# Patient Record
Sex: Female | Born: 1975 | Race: Black or African American | Hispanic: No | Marital: Married | Smoking: Never smoker
Health system: Southern US, Community
[De-identification: ages and names within clinical notes are randomized; demographics above are authoritative.]

## PROBLEM LIST (undated history)

## (undated) DIAGNOSIS — M24111 Other articular cartilage disorders, right shoulder: Secondary | ICD-10-CM

## (undated) DIAGNOSIS — Z87898 Personal history of other specified conditions: Secondary | ICD-10-CM

## (undated) DIAGNOSIS — I10 Essential (primary) hypertension: Secondary | ICD-10-CM

## (undated) DIAGNOSIS — K219 Gastro-esophageal reflux disease without esophagitis: Secondary | ICD-10-CM

## (undated) DIAGNOSIS — G4700 Insomnia, unspecified: Secondary | ICD-10-CM

## (undated) DIAGNOSIS — M7551 Bursitis of right shoulder: Secondary | ICD-10-CM

## (undated) DIAGNOSIS — D649 Anemia, unspecified: Secondary | ICD-10-CM

## (undated) DIAGNOSIS — F32A Depression, unspecified: Secondary | ICD-10-CM

## (undated) DIAGNOSIS — F329 Major depressive disorder, single episode, unspecified: Secondary | ICD-10-CM

## (undated) DIAGNOSIS — F419 Anxiety disorder, unspecified: Secondary | ICD-10-CM

## (undated) HISTORY — DX: Major depressive disorder, single episode, unspecified: F32.9

## (undated) HISTORY — DX: Insomnia, unspecified: G47.00

## (undated) HISTORY — PX: DIAGNOSTIC LAPAROSCOPY: SUR761

## (undated) HISTORY — DX: Anxiety disorder, unspecified: F41.9

## (undated) HISTORY — DX: Depression, unspecified: F32.A

## (undated) HISTORY — PX: WISDOM TOOTH EXTRACTION: SHX21

## (undated) HISTORY — PX: ECTOPIC PREGNANCY SURGERY: SHX613

## (undated) HISTORY — DX: Essential (primary) hypertension: I10

## (undated) HISTORY — DX: Gastro-esophageal reflux disease without esophagitis: K21.9

---

## 1997-08-17 ENCOUNTER — Emergency Department (HOSPITAL_COMMUNITY): Admission: EM | Admit: 1997-08-17 | Discharge: 1997-08-17 | Payer: Self-pay | Admitting: Internal Medicine

## 1998-09-23 ENCOUNTER — Encounter (INDEPENDENT_AMBULATORY_CARE_PROVIDER_SITE_OTHER): Payer: Self-pay | Admitting: Specialist

## 1998-09-23 ENCOUNTER — Other Ambulatory Visit: Admission: RE | Admit: 1998-09-23 | Discharge: 1998-09-23 | Payer: Self-pay | Admitting: Obstetrics and Gynecology

## 1998-10-21 ENCOUNTER — Emergency Department (HOSPITAL_COMMUNITY): Admission: EM | Admit: 1998-10-21 | Discharge: 1998-10-21 | Payer: Self-pay | Admitting: Emergency Medicine

## 1998-12-23 ENCOUNTER — Inpatient Hospital Stay (HOSPITAL_COMMUNITY): Admission: AD | Admit: 1998-12-23 | Discharge: 1998-12-23 | Payer: Self-pay | Admitting: Obstetrics and Gynecology

## 1999-04-20 ENCOUNTER — Ambulatory Visit (HOSPITAL_COMMUNITY): Admission: RE | Admit: 1999-04-20 | Discharge: 1999-04-20 | Payer: Self-pay | Admitting: Obstetrics and Gynecology

## 1999-05-05 ENCOUNTER — Encounter: Admission: RE | Admit: 1999-05-05 | Discharge: 1999-05-13 | Payer: Self-pay | Admitting: Obstetrics and Gynecology

## 1999-05-28 ENCOUNTER — Inpatient Hospital Stay (HOSPITAL_COMMUNITY): Admission: AD | Admit: 1999-05-28 | Discharge: 1999-05-28 | Payer: Self-pay | Admitting: *Deleted

## 1999-07-14 ENCOUNTER — Inpatient Hospital Stay (HOSPITAL_COMMUNITY): Admission: AD | Admit: 1999-07-14 | Discharge: 1999-07-14 | Payer: Self-pay | Admitting: *Deleted

## 1999-09-05 ENCOUNTER — Inpatient Hospital Stay (HOSPITAL_COMMUNITY): Admission: AD | Admit: 1999-09-05 | Discharge: 1999-09-08 | Payer: Self-pay | Admitting: Obstetrics and Gynecology

## 1999-10-06 ENCOUNTER — Other Ambulatory Visit: Admission: RE | Admit: 1999-10-06 | Discharge: 1999-10-06 | Payer: Self-pay | Admitting: Obstetrics and Gynecology

## 2001-04-04 ENCOUNTER — Emergency Department (HOSPITAL_COMMUNITY): Admission: EM | Admit: 2001-04-04 | Discharge: 2001-04-04 | Payer: Self-pay | Admitting: Emergency Medicine

## 2001-04-04 ENCOUNTER — Encounter: Payer: Self-pay | Admitting: Emergency Medicine

## 2001-08-28 ENCOUNTER — Other Ambulatory Visit: Admission: RE | Admit: 2001-08-28 | Discharge: 2001-08-28 | Payer: Self-pay | Admitting: Gynecology

## 2002-09-29 ENCOUNTER — Encounter: Admission: RE | Admit: 2002-09-29 | Discharge: 2002-09-29 | Payer: Self-pay | Admitting: Internal Medicine

## 2002-09-29 ENCOUNTER — Encounter: Payer: Self-pay | Admitting: Internal Medicine

## 2003-09-01 ENCOUNTER — Emergency Department (HOSPITAL_COMMUNITY): Admission: EM | Admit: 2003-09-01 | Discharge: 2003-09-01 | Payer: Self-pay | Admitting: Emergency Medicine

## 2003-09-08 ENCOUNTER — Ambulatory Visit (HOSPITAL_BASED_OUTPATIENT_CLINIC_OR_DEPARTMENT_OTHER): Admission: RE | Admit: 2003-09-08 | Discharge: 2003-09-08 | Payer: Self-pay | Admitting: Orthopedic Surgery

## 2003-09-08 HISTORY — PX: ORIF ANKLE FRACTURE: SHX5408

## 2004-10-06 ENCOUNTER — Other Ambulatory Visit: Admission: RE | Admit: 2004-10-06 | Discharge: 2004-10-06 | Payer: Self-pay | Admitting: Obstetrics and Gynecology

## 2004-10-13 ENCOUNTER — Encounter: Admission: RE | Admit: 2004-10-13 | Discharge: 2004-10-13 | Payer: Self-pay | Admitting: Obstetrics and Gynecology

## 2005-03-15 ENCOUNTER — Ambulatory Visit (HOSPITAL_COMMUNITY): Admission: RE | Admit: 2005-03-15 | Discharge: 2005-03-15 | Payer: Self-pay | Admitting: Obstetrics and Gynecology

## 2006-07-15 ENCOUNTER — Emergency Department (HOSPITAL_COMMUNITY): Admission: EM | Admit: 2006-07-15 | Discharge: 2006-07-15 | Payer: Self-pay | Admitting: Emergency Medicine

## 2006-12-05 ENCOUNTER — Emergency Department (HOSPITAL_COMMUNITY): Admission: EM | Admit: 2006-12-05 | Discharge: 2006-12-05 | Payer: Self-pay | Admitting: Emergency Medicine

## 2007-11-06 ENCOUNTER — Ambulatory Visit: Payer: Self-pay | Admitting: Internal Medicine

## 2007-11-06 DIAGNOSIS — R0789 Other chest pain: Secondary | ICD-10-CM | POA: Insufficient documentation

## 2007-11-06 DIAGNOSIS — D649 Anemia, unspecified: Secondary | ICD-10-CM | POA: Insufficient documentation

## 2007-11-06 DIAGNOSIS — G479 Sleep disorder, unspecified: Secondary | ICD-10-CM | POA: Insufficient documentation

## 2007-11-06 DIAGNOSIS — J45909 Unspecified asthma, uncomplicated: Secondary | ICD-10-CM | POA: Insufficient documentation

## 2007-11-06 DIAGNOSIS — R0602 Shortness of breath: Secondary | ICD-10-CM | POA: Insufficient documentation

## 2007-11-06 DIAGNOSIS — R011 Cardiac murmur, unspecified: Secondary | ICD-10-CM | POA: Insufficient documentation

## 2007-11-08 ENCOUNTER — Telehealth: Payer: Self-pay | Admitting: *Deleted

## 2007-11-12 ENCOUNTER — Telehealth: Payer: Self-pay | Admitting: Internal Medicine

## 2007-11-14 ENCOUNTER — Ambulatory Visit: Payer: Self-pay

## 2007-11-14 ENCOUNTER — Encounter: Payer: Self-pay | Admitting: Internal Medicine

## 2007-11-14 ENCOUNTER — Ambulatory Visit: Payer: Self-pay | Admitting: Internal Medicine

## 2007-11-19 ENCOUNTER — Telehealth: Payer: Self-pay | Admitting: *Deleted

## 2007-11-22 ENCOUNTER — Ambulatory Visit: Payer: Self-pay | Admitting: Cardiology

## 2007-11-29 ENCOUNTER — Ambulatory Visit: Payer: Self-pay

## 2007-11-29 ENCOUNTER — Ambulatory Visit: Payer: Self-pay | Admitting: Cardiology

## 2008-04-15 ENCOUNTER — Encounter: Admission: RE | Admit: 2008-04-15 | Discharge: 2008-04-15 | Payer: Self-pay | Admitting: Cardiology

## 2008-07-01 ENCOUNTER — Encounter (INDEPENDENT_AMBULATORY_CARE_PROVIDER_SITE_OTHER): Payer: Self-pay | Admitting: *Deleted

## 2008-11-05 ENCOUNTER — Ambulatory Visit: Payer: Self-pay | Admitting: Family Medicine

## 2008-11-10 ENCOUNTER — Ambulatory Visit: Payer: Self-pay | Admitting: Internal Medicine

## 2008-11-10 DIAGNOSIS — R51 Headache: Secondary | ICD-10-CM | POA: Insufficient documentation

## 2008-11-10 DIAGNOSIS — R519 Headache, unspecified: Secondary | ICD-10-CM | POA: Insufficient documentation

## 2008-12-02 ENCOUNTER — Ambulatory Visit: Payer: Self-pay | Admitting: Cardiology

## 2008-12-02 DIAGNOSIS — E785 Hyperlipidemia, unspecified: Secondary | ICD-10-CM | POA: Insufficient documentation

## 2009-04-04 ENCOUNTER — Emergency Department (HOSPITAL_COMMUNITY): Admission: EM | Admit: 2009-04-04 | Discharge: 2009-04-04 | Payer: Self-pay | Admitting: Emergency Medicine

## 2009-10-22 ENCOUNTER — Inpatient Hospital Stay (HOSPITAL_COMMUNITY): Admission: AD | Admit: 2009-10-22 | Discharge: 2009-10-22 | Payer: Self-pay | Admitting: Obstetrics and Gynecology

## 2009-12-01 ENCOUNTER — Inpatient Hospital Stay (HOSPITAL_COMMUNITY): Admission: RE | Admit: 2009-12-01 | Discharge: 2009-12-04 | Payer: Self-pay | Admitting: Obstetrics and Gynecology

## 2010-03-06 LAB — CONVERTED CEMR LAB
AST: 20 units/L (ref 0–37)
Alkaline Phosphatase: 77 units/L (ref 39–117)
BUN: 8 mg/dL (ref 6–23)
Basophils Absolute: 0 10*3/uL (ref 0.0–0.1)
Basophils Relative: 0.2 % (ref 0.0–3.0)
CO2: 23 meq/L (ref 19–32)
Calcium: 9 mg/dL (ref 8.4–10.5)
Eosinophils Relative: 1.1 % (ref 0.0–5.0)
HCT: 32.8 % — ABNORMAL LOW (ref 36.0–46.0)
Lymphocytes Relative: 40.7 % (ref 12.0–46.0)
MCHC: 32.9 g/dL (ref 30.0–36.0)
Monocytes Absolute: 0.5 10*3/uL (ref 0.1–1.0)
Monocytes Relative: 12.9 % — ABNORMAL HIGH (ref 3.0–12.0)
Neutro Abs: 1.6 10*3/uL (ref 1.4–7.7)
Neutrophils Relative %: 45.1 % (ref 43.0–77.0)
Platelets: 297 10*3/uL (ref 150–400)
Potassium: 3.6 meq/L (ref 3.5–5.1)
RBC: 4.42 M/uL (ref 3.87–5.11)
Sodium: 136 meq/L (ref 135–145)
Total Protein: 7.3 g/dL (ref 6.0–8.3)
VLDL: 7 mg/dL (ref 0–40)
WBC: 3.5 10*3/uL — ABNORMAL LOW (ref 4.5–10.5)

## 2010-04-20 LAB — TYPE AND SCREEN: ABO/RH(D): O POS

## 2010-04-20 LAB — CBC
HCT: 22.5 % — ABNORMAL LOW (ref 36.0–46.0)
HCT: 28 % — ABNORMAL LOW (ref 36.0–46.0)
Hemoglobin: 8.7 g/dL — ABNORMAL LOW (ref 12.0–15.0)
MCH: 20 pg — ABNORMAL LOW (ref 26.0–34.0)
MCH: 20 pg — ABNORMAL LOW (ref 26.0–34.0)
MCHC: 31.2 g/dL (ref 30.0–36.0)
MCV: 64.9 fL — ABNORMAL LOW (ref 78.0–100.0)
RBC: 3.46 MIL/uL — ABNORMAL LOW (ref 3.87–5.11)
RDW: 25.8 % — ABNORMAL HIGH (ref 11.5–15.5)
WBC: 7 10*3/uL (ref 4.0–10.5)

## 2010-04-20 LAB — SURGICAL PCR SCREEN
MRSA, PCR: NEGATIVE
Staphylococcus aureus: NEGATIVE

## 2010-04-21 LAB — URINALYSIS, ROUTINE W REFLEX MICROSCOPIC
Nitrite: NEGATIVE
Protein, ur: NEGATIVE mg/dL
Specific Gravity, Urine: 1.01 (ref 1.005–1.030)
Urobilinogen, UA: 1 mg/dL (ref 0.0–1.0)

## 2010-04-21 LAB — WET PREP, GENITAL

## 2010-04-21 LAB — FETAL FIBRONECTIN: Fetal Fibronectin: NEGATIVE

## 2010-04-21 LAB — URINE MICROSCOPIC-ADD ON

## 2010-06-21 NOTE — Assessment & Plan Note (Signed)
[facility]                            CARDIOLOGY OFFICE NOTE   NAME:Breanna [name], Breanna [name]                   MRN:          [id number]  DATE:11/22/2007                            DOB:          1975-04-10    ADDENDUM   PRIMARY CARE PHYSICIAN:  Neta Mends. Panosh, MD   The addendum is that the patient's LDL cholesterol was obtained.  Her  LDL is 172.  This is quite high.  I did not talk to the patient at  length about the need for diet and exercise; however, I think it is  going to be difficult for her to lower her LDL down to below 130, which  would be my goal for her, therefore, I will start her on a low dose of  Lipitor at 10 mg daily and hopefully in combination with diet and  exercising, we can get her LDL to at least down below 130.  If she does  indeed have any problems on her stress test or on her exercise treadmill  test, which I doubt, we will attempt to get her LDL even lower.  I will  bring her back in 3 months.  We will check her lipids and her LFTs and  additionally we will check a Lp(a).     Marca Ancona, MD     DM/MedQ  DD: 11/22/2007  DT: 11/22/2007  Job #: [id number]   cc:   Neta Mends. Fabian Sharp, MD

## 2010-06-21 NOTE — Assessment & Plan Note (Signed)
[facility]                            CARDIOLOGY OFFICE NOTE   NAME:MCINTYRE, TANEA MOGA                   MRN:          [id number]  DATE:11/22/2007                            DOB:          August 25, 1975    PRIMARY CARE PHYSICIAN:  Neta Mends. Panosh, MD   HISTORY OF PRESENT ILLNESS:  This is a 35 year old with a history of  asthma who presents to Cardiology Clinic for initial evaluation of chest  tightness and shortness of breath.  The patient states that she has had  intermittent episodes of chest tightness and short of breath for about  10 years; however, over the last month she has had daily up to several  times a day episodes of central chest tightness and squeezing associated  with shortness of breath.  These episodes can last 30 minutes to 2  hours.  Most of these episodes do seem to be related to stress; however,  they do not tend to be exertional and many will occur while she is at  rest.  They often happen when she is at work.  She works as a Public librarian at [employer], which has been a fairly high  stress job.  Additionally, she sometimes gets these episodes at home  when her sons are acting up and sometimes the episodes just happen when  she is at rest.  She states that she has mainly been under a little bit  more stress than normal this month; however, she states not that much  more than normal.  She has been somewhat more busy at work.  She is not  very active, gets minimal exercise due to her busy work schedule.  She  does not describe shortness of breath or chest pain with exertion.  She  states that 1 year ago she went to the emergency department with an  episode of shortness of breath that was not associated with chest pain.  She states that the doctor in the emergency department heard wheezing  and diagnosed her with asthma.  She was prescribed with inhalers;  however, she has never used inhalers.  She states that  she does not  notice wheezing when she has chest pain episodes.   PAST MEDICAL HISTORY:  1. Asthma.  This was diagnosed about a year ago in the emergency      department; however, the patient is not using any inhalers.  2. Iron-deficiency anemia.   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  She answers phones throughout the day.  She states that she  smokes maybe 1-2 cigarettes every 2-3 months.  She usually smokes when  she is under a lot of stress.  She drinks alcohol rarely.  Does not use  any illicit drugs.   FAMILY HISTORY:  The patient states that her mother had an episode of  severe chest pain when she was 21 and she was hospitalized; however, the  patient does not know exactly what the diagnosis at that time was.  She  does know her mother does take some medications  and she has  hypertension.  Her grandmother had coronary artery bypass grafting in  her 64s and grandfather had coronary artery bypass grafting in his 67s.   MEDICATIONS:  She takes vitamin D.   No known drug allergies.   Review of systems is negative except as noted in history of present  illness.   EKG shows normal sinus rhythm, is a normal EKG.   Most recent labs in September showed triglycerides of 33, HDL of 60;  however, the LDL has been completed.  Hematocrit 32.8.  LFTs normal.  Creatinine 0.7.  Thyroid function is normal.  Echocardiogram done this  month, October 2009 shows EF 55-60%, grade 1 diastolic dysfunction.  Left atrium in the upper limits of normal.  Pulmonary artery systolic  pressure 21 mmHg.  No significant valvular dysfunction.  No regional  wall motion abnormalities in left ventricle.   PHYSICAL EXAMINATION:  VITAL SIGNS:  Blood pressure is 110/76, heart  rate is 70 and regular, weight is 180 pounds.  GENERAL:  This is a well-developed female in no apparent distress.  NEUROLOGICAL:  Alert and oriented x3.  Normal affect.  HEENT:  Normal.  NECK:  No  thyromegaly or thyroid nodule.  There is no JVD.  HEART:  Regular S1 and S2.  No S3, no S4, no murmur.  There is no  peripheral edema.  There are 2+ posterior tibial pulses bilaterally.  ABDOMEN:  Soft, nontender.  No hepatosplenomegaly.  EXTREMITIES:  There is no clubbing or cyanosis.  SKIN:  Normal.  MUSCULOSKELETAL:  Normal.   ASSESSMENT/PLAN:  This is a 35 year old with history of significant  chest tightness, squeezing, and shortness of breath.  These episodes are  not associated in particular with exertion.  They often come on with  stress.  She does not have lot of cardiac risk factors.  I am uncertain  of her LDL level.  It is possible that she has a family history of early  coronary disease as her mother may have had an event in her 30s;  however, I do not have all the details on that.  She does smoke only  rarely and her blood pressure is excellent.  I think a more likely cause  of her chest pain may be asthma as she was diagnosed with asthma with  shortness of breath and wheezing about a year ago and she has not been  on any inhalers, this could be a possible cause of her episodes of chest  tightness and shortness of breath.  These also could be a type of panic  attack given that they often happen with stress.  Therefore, my plan for  her will be to give her a trial of an Advair Diskus to see if that helps  her symptoms at all.  Additionally, we will bring her into the office  for an exercise treadmill test just to make sure that there is no  evidence for any ischemia given the possible history of early coronary  disease in her mother and we will also check an LDL cholesterol since  that apparently was deleted from her prior labs.  We can go ahead and do  that today.   ADDENDUM:  The addendum is that the patient's LDL cholesterol was  obtained.  Her LDL is 172.  This is quite high.  I did talk to the  patient at length about the need for diet and exercise; however, I think   it is going to be difficult  for her to lower her LDL down to below 130,  which would be my goal for her, therefore, I will start her on a low  dose of Lipitor at 10 mg daily and hopefully in combination with diet  and exercising, we can get her LDL to at least down below 130.  If she  does indeed have any problems on her exercise treadmill test, which I  doubt, we will  attempt to get her LDL even lower.  I will bring her back in 3 months.  We will check her lipids and her LFTs and additionally we will check a  Lp(a).     Marca Ancona, MD  Electronically Signed    DM/MedQ  DD: 11/22/2007  DT: 11/22/2007  Job #: [id number]   cc:   Neta Mends. Fabian Sharp, MD

## 2010-06-21 NOTE — Procedures (Signed)
[facility]                              EXERCISE TREADMILL   NAME:[name], Breanna BAINS                   MRN:          [id number]  DATE:11/29/2007                            DOB:          February 24, 1975    PROCEDURE:  Exercise treadmill test.   INDICATION:  Substernal chest pain, hypercholesterolemia, and obesity.   PROCEDURE:  The patient exercised according to standard Bruce protocol  for 7 minutes achieving a work level of 8.5 METS, the resting heart rate  of 94 beats per minute, ratio of maximal heart rate of 187 beats per  minute.  This value represents 98% of the maximal age predicted heart  rate.  The resting blood pressure 103/61 rose to maximum blood pressure  of 138/70.  The exercise test was stopped due to fatigue.  There was no  chest pain.   INTERPRETATION:  The resting EKG was normal, showed normal sinus rhythm  and no ST-T wave changes.  Blood pressure response to exercise was  normal.  Heart rate response to exercise showed an accelerate rise in  the heart rate beginning at stage I.  There was no chest pain.  There  were no arrhythmias.  There are no ischemic ST-segment changes.   CONCLUSION:  This symptom-limited exercise treadmill test shows no  evidence for ischemia with normal EKG response.  Given the patient's  age, she does appear to have below normal exercise capacity.  I did  encourage her to increase her exercise level with walking at least 5  times a week.  The patient will follow up with Dr. Fabian Sharp.  She has been  started on Lipitor given her elevated LDL and she will need a lipid and  LFT check in approximately 3 months.     Marca Ancona, MD  Electronically Signed    DM/MedQ  DD: 11/29/2007  DT: 11/30/2007  Job #: [id number]   cc:   Neta Mends. Fabian Sharp, MD

## 2010-06-24 NOTE — Discharge Summary (Signed)
[facility]  Patient:    Breanna [name], Breanna [name]                   MRN: [id number] Adm. Date:  [id number] Disc. Date: [id number] Attending:  Cordelia Pen Ii Dictator:   Danie Chandler, R.N.                           Discharge Summary  ADMISSION DIAGNOSES:          1. Intrauterine pregnancy at 38-1/[redacted] weeks                                  gestation.                               2. Previous cesarean section, desires repeat.  DISCHARGE DIAGNOSES:          1. Intrauterine pregnancy at 38-1/[redacted] weeks                                  gestation.                               2. Previous cesarean section, desires repeat.                               3. Anemia.  PROCEDURES:                   On September 05, 1999, repeat low transverse cesarean section.  HISTORY OF PRESENT ILLNESS:   The patient is a 35 year old, single, black female, gravida 2, para 1, with an estimated date of confinement of September 16, 1999.  The patients history was complicated by a positive gonorrhea culture on December 15, 1998, which was treated with subsequent negative test of cure. The patient had a previous cesarean section and desires repeat.  HOSPITAL COURSE:              The patient was taken to the operating room and underwent the above-named procedure without complications.  This was productive of a viable female infant with Apgars of 9 at one minute and 9 at five minutes and an arterial cord pH of 7.30.  Postoperatively, the patient did well.  On postoperative day #1, the patients hemoglobin was 7.1.  She was started on iron daily.  Her vital signs were stable.  On postoperative day #2, the patients hemoglobin was 6.9.  She had a repeat CBC ordered for the following day.  On postoperative day #3, the patient was without complaints and the hemoglobin was stable at 7.5.  She was tolerating a regular diet and had a good return of bowel function.  She was also ambulating well  without difficulty and had good pain control.  CONDITION ON DISCHARGE:       Good.  DIET:                         Regular as tolerated.  ACTIVITY:                     No heavy lifting,  no driving, and no vaginal entry.  FOLLOW-UP:                    She is to follow up in the office in one to two weeks for incision check.  She is to call for temperature greater than 100 degrees, persistent nausea, vomiting, heavy vaginal bleeding, and/or redness or drainage from the incision site.  DISCHARGE MEDICATIONS:        1. Tylox, #20, one p.o. q.4-6h. p.r.n. pain.                               2. Nu-Iron 150 mg, #30, one p.o. q.d. DD:  09/22/99 TD:  09/23/99 Job: [id number]

## 2010-06-24 NOTE — Op Note (Signed)
NAME:  Breanna [name], Breanna [name]                      ACCOUNT NO.:  [id number]   MEDICAL RECORD NO.:  [id number]                   PATIENT TYPE:  AMB   LOCATION:  [facility]                                  FACILITY:  [facility]   PHYSICIAN:  Robert A. Thurston Hole, M.D.              DATE OF BIRTH:  08-31-1975   DATE OF PROCEDURE:  09/08/2003  DATE OF DISCHARGE:                                 OPERATIVE REPORT   PREOPERATIVE DIAGNOSES:  Right ankle fracture with syndesmosis disruption.   POSTOPERATIVE DIAGNOSES:  Right ankle fracture with syndesmosis disruption.   PROCEDURE:  Open reduction and internal fixation of right ankle  fracture/syndesmosis disruption.   SURGEON:  Elana Alm. Thurston Hole, M.D.   ASSISTANT:  Julien Girt, P.A.   ANESTHESIA:  General.   OPERATIVE TIME:  Was 30 minutes.   COMPLICATIONS:  None.   INDICATIONS FOR PROCEDURE:  The patient is a 35 year old woman who sustained  a twisting injury to her right ankle one week ago, sustaining a syndesmosis  disruption with displacement of the mortis.  She is now to undergo an ORIF  of this.   DESCRIPTION OF PROCEDURE:  The patient was brought to the operating room on  September 08, 2003, and placed on the operating room table in the supine  position.  After an adequate level of general anesthesia was obtained, she  received Ancef 1 g IV preoperatively for prophylaxis.  Her right foot and  leg were prepped using sterile DuraPrep and draped using a sterile  technique.  Initially through a 1 cm longitudinal incision based over the  distal fibula shaft, using fluoroscopic control at the level of the tibial  epiphyseal scar, an initial incision was made.  The underlying subcutaneous  tissues were incised in line with the skin incision.  Using a guide pin from  the cannulated 4.5 mm screw set, and holding the mortis in a reduced and  anatomic position, a guide wire was placed from the fibula across the  syndesmosis into the distal tibial  metaphysis.  This was measured for length  and then over-drilled with a 3.2 mm drill, and then a cannulated 4.5 mm x  54.0 mm screw was placed, holding the ankle in a reduced and anatomic  position.  With this screw in place and position, and tying down, the mortis  was anatomic.  There was minimal instability on stress testing and external  rotation, and with the ankle in neutral, the mortis was completely anatomic.  At this point then the wound was irrigated and closed with interrupted #4-0  nylon suture.  Sterile dressings and a short-leg splint were applied.  Then the patient was awakened and taken to the recovery room in stable  condition.   FOLLOW-UP CARE:  The patient will be followed as an outpatient on Percocet  for pain.  I will see her back in the office in one week for a wound check  and  followup.                                               Robert A. Thurston Hole, M.D.    RAW/MEDQ  D:  09/08/2003  T:  09/08/2003  Job:  [id number]

## 2010-11-08 ENCOUNTER — Other Ambulatory Visit: Payer: Self-pay | Admitting: Obstetrics and Gynecology

## 2010-11-16 LAB — BASIC METABOLIC PANEL
Chloride: 103
GFR calc non Af Amer: >60
Glucose, Bld: 149 — ABNORMAL HIGH
Potassium: 3.3 — ABNORMAL LOW
Sodium: 134 — ABNORMAL LOW

## 2010-11-16 LAB — URINALYSIS, ROUTINE W REFLEX MICROSCOPIC
Bilirubin Urine: NEGATIVE
Glucose, UA: NEGATIVE
Nitrite: NEGATIVE
Specific Gravity, Urine: 1.014
pH: 7

## 2010-11-16 LAB — PREGNANCY, URINE: Preg Test, Ur: NEGATIVE

## 2010-11-24 LAB — DIFFERENTIAL
Lymphs Abs: 1.7
Monocytes Relative: 13 — ABNORMAL HIGH
Neutro Abs: 2.2
Neutrophils Relative %: 48

## 2010-11-24 LAB — I-STAT 8, (EC8 V) (CONVERTED LAB)
Bicarbonate: 21.4
Glucose, Bld: 86
TCO2: 22
pCO2, Ven: 29.9 — ABNORMAL LOW
pH, Ven: 7.463 — ABNORMAL HIGH

## 2010-11-24 LAB — POCT I-STAT CREATININE: Operator id: 257131

## 2010-11-24 LAB — POCT CARDIAC MARKERS
Myoglobin, poc: 39.2
Operator id: 257131
Troponin i, poc: <0.05

## 2010-11-24 LAB — POCT PREGNANCY, URINE: Preg Test, Ur: NEGATIVE

## 2010-11-24 LAB — CBC
Hemoglobin: 10.9 — ABNORMAL LOW
MCV: 78.4
RBC: 4.34
WBC: 4.6

## 2012-07-05 ENCOUNTER — Other Ambulatory Visit: Payer: Self-pay | Admitting: Obstetrics and Gynecology

## 2012-07-17 ENCOUNTER — Encounter (HOSPITAL_COMMUNITY): Payer: Self-pay | Admitting: Pharmacist

## 2012-07-18 MED ORDER — ONDANSETRON HCL 4 MG/2ML IJ SOLN
INTRAMUSCULAR | Status: AC
  Filled 2012-07-18: qty 2

## 2012-07-24 ENCOUNTER — Encounter (HOSPITAL_COMMUNITY)
Admission: RE | Admit: 2012-07-24 | Discharge: 2012-07-24 | Disposition: A | Discharge status: Home / Self Care | Payer: 59 | Source: Ambulatory Visit | Attending: Obstetrics and Gynecology | Admitting: Obstetrics and Gynecology

## 2012-07-24 ENCOUNTER — Encounter (HOSPITAL_COMMUNITY): Payer: Self-pay

## 2012-07-24 LAB — CBC
HCT: 39.4 % (ref 36.0–46.0)
Hemoglobin: 13 g/dL (ref 12.0–15.0)
MCHC: 33 g/dL (ref 30.0–36.0)
MCV: 79.3 fL (ref 78.0–100.0)

## 2012-07-24 LAB — SURGICAL PCR SCREEN: Staphylococcus aureus: NEGATIVE

## 2012-07-24 MED ORDER — MIDAZOLAM HCL 2 MG/2ML IJ SOLN
INTRAMUSCULAR | Status: AC
  Filled 2012-07-24: qty 2

## 2012-07-24 NOTE — Patient Instructions (Addendum)
20 Breanna [name]  07/24/2012   Your procedure is scheduled on:  07/25/12  Enter through the Main Entrance of [facility] at 6 AM.  Pick up the phone at the desk and dial [phone].   Call this number if you have problems the morning of surgery: [phone]   Remember:   Do not eat food:After Midnight.  Do not drink clear liquids: After Midnight.  Take these medicines the morning of surgery with A SIP OF WATER: NA   Do not wear jewelry, make-up or nail polish.  Do not wear lotions, powders, or perfumes. You may wear deodorant.  Do not shave 48 hours prior to surgery.  Do not bring valuables to the hospital.  [facility] is not responsible                  for any belongings or valuables brought to the hospital.  Contacts, dentures or bridgework may not be worn into surgery.  Leave suitcase in the car. After surgery it may be brought to your room.  For patients admitted to the hospital, checkout time is 11:00 AM the day of                discharge.   Patients discharged the day of surgery will not be allowed to drive                   home.  Name and phone number of your driver: [name]  Special Instructions: Shower using CHG 2 nights before surgery and the night before surgery.  If you shower the day of surgery use CHG.  Use special wash - you have one bottle of CHG for all showers.  You should use approximately 1/3 of the bottle for each shower.   Please read over the following fact sheets that you were given: MRSA Information

## 2012-07-25 ENCOUNTER — Ambulatory Visit (HOSPITAL_COMMUNITY)
Admission: RE | Admit: 2012-07-25 | Discharge: 2012-07-25 | Disposition: A | Discharge status: Home / Self Care | Payer: 59 | Source: Ambulatory Visit | Attending: Obstetrics and Gynecology | Admitting: Obstetrics and Gynecology

## 2012-07-25 ENCOUNTER — Encounter (HOSPITAL_COMMUNITY): Payer: Self-pay | Admitting: Anesthesiology

## 2012-07-25 ENCOUNTER — Encounter (HOSPITAL_COMMUNITY): Payer: Self-pay | Admitting: *Deleted

## 2012-07-25 ENCOUNTER — Encounter (HOSPITAL_COMMUNITY)
Admission: RE | Disposition: A | Discharge status: Home / Self Care | Payer: Self-pay | Source: Ambulatory Visit | Attending: Obstetrics and Gynecology

## 2012-07-25 ENCOUNTER — Ambulatory Visit (HOSPITAL_COMMUNITY): Payer: 59 | Admitting: Anesthesiology

## 2012-07-25 DIAGNOSIS — Z302 Encounter for sterilization: Secondary | ICD-10-CM | POA: Insufficient documentation

## 2012-07-25 DIAGNOSIS — E669 Obesity, unspecified: Secondary | ICD-10-CM | POA: Insufficient documentation

## 2012-07-25 DIAGNOSIS — Z30432 Encounter for removal of intrauterine contraceptive device: Secondary | ICD-10-CM | POA: Insufficient documentation

## 2012-07-25 DIAGNOSIS — IMO0001 Reserved for inherently not codable concepts without codable children: Secondary | ICD-10-CM

## 2012-07-25 HISTORY — PX: LAPAROSCOPIC TUBAL LIGATION: SHX1937

## 2012-07-25 HISTORY — PX: IUD REMOVAL: SHX5392

## 2012-07-25 SURGERY — LIGATION, FALLOPIAN TUBE, LAPAROSCOPIC
Anesthesia: General | Site: Vagina | Wound class: Clean

## 2012-07-25 MED ORDER — ROCURONIUM BROMIDE 100 MG/10ML IV SOLN
INTRAVENOUS | Status: DC | PRN
  Administered 2012-07-25: 25 mg via INTRAVENOUS

## 2012-07-25 MED ORDER — ACETAMINOPHEN 160 MG/5ML PO SOLN
ORAL | Status: AC
  Administered 2012-07-25: 975 mg via ORAL
  Filled 2012-07-25: qty 40.6

## 2012-07-25 MED ORDER — ACETAMINOPHEN 160 MG/5ML PO SOLN: 975.0000 mg | Freq: Once | ORAL | Status: AC

## 2012-07-25 MED ORDER — BUPIVACAINE HCL (PF) 0.25 % IJ SOLN
INTRAMUSCULAR | Status: AC
  Filled 2012-07-25: qty 30

## 2012-07-25 MED ORDER — CEFAZOLIN SODIUM-DEXTROSE 2-3 GM-% IV SOLR
INTRAVENOUS | Status: AC
  Filled 2012-07-25: qty 50

## 2012-07-25 MED ORDER — PROPOFOL 10 MG/ML IV EMUL
INTRAVENOUS | Status: AC
  Filled 2012-07-25: qty 20

## 2012-07-25 MED ORDER — ONDANSETRON HCL 4 MG/2ML IJ SOLN
INTRAMUSCULAR | Status: AC
  Filled 2012-07-25: qty 2

## 2012-07-25 MED ORDER — LACTATED RINGERS IV SOLN
INTRAVENOUS | Status: DC
  Administered 2012-07-25 (×2): via INTRAVENOUS

## 2012-07-25 MED ORDER — MIDAZOLAM HCL 2 MG/2ML IJ SOLN
INTRAMUSCULAR | Status: AC
  Filled 2012-07-25: qty 2

## 2012-07-25 MED ORDER — GLYCOPYRROLATE 0.2 MG/ML IJ SOLN
INTRAMUSCULAR | Status: AC
  Filled 2012-07-25: qty 3

## 2012-07-25 MED ORDER — CEFAZOLIN SODIUM-DEXTROSE 2-3 GM-% IV SOLR
2.0000 g | INTRAVENOUS | Status: AC
  Administered 2012-07-25: 2 g via INTRAVENOUS

## 2012-07-25 MED ORDER — LIDOCAINE HCL (CARDIAC) 20 MG/ML IV SOLN
INTRAVENOUS | Status: DC | PRN
  Administered 2012-07-25: 40 mg via INTRAVENOUS
  Administered 2012-07-25 (×2): 30 mg via INTRAVENOUS

## 2012-07-25 MED ORDER — DEXAMETHASONE SODIUM PHOSPHATE 10 MG/ML IJ SOLN
INTRAMUSCULAR | Status: AC
  Filled 2012-07-25: qty 1

## 2012-07-25 MED ORDER — FENTANYL CITRATE 0.05 MG/ML IJ SOLN
INTRAMUSCULAR | Status: AC
  Administered 2012-07-25: 25 ug via INTRAVENOUS
  Filled 2012-07-25: qty 2

## 2012-07-25 MED ORDER — KETOROLAC TROMETHAMINE 30 MG/ML IJ SOLN
INTRAMUSCULAR | Status: DC | PRN
  Administered 2012-07-25: 30 mg via INTRAVENOUS

## 2012-07-25 MED ORDER — NEOSTIGMINE METHYLSULFATE 1 MG/ML IJ SOLN
INTRAMUSCULAR | Status: DC | PRN
  Administered 2012-07-25: 3 mg via INTRAVENOUS

## 2012-07-25 MED ORDER — FENTANYL CITRATE 0.05 MG/ML IJ SOLN
INTRAMUSCULAR | Status: DC | PRN
  Administered 2012-07-25 (×2): 50 ug via INTRAVENOUS

## 2012-07-25 MED ORDER — FENTANYL CITRATE 0.05 MG/ML IJ SOLN
INTRAMUSCULAR | Status: AC
  Filled 2012-07-25: qty 2

## 2012-07-25 MED ORDER — FENTANYL CITRATE 0.05 MG/ML IJ SOLN
25.0000 ug | INTRAMUSCULAR | Status: DC | PRN
  Administered 2012-07-25: 25 ug via INTRAVENOUS

## 2012-07-25 MED ORDER — NEOSTIGMINE METHYLSULFATE 1 MG/ML IJ SOLN
INTRAMUSCULAR | Status: AC
  Filled 2012-07-25: qty 1

## 2012-07-25 MED ORDER — GLYCOPYRROLATE 0.2 MG/ML IJ SOLN
INTRAMUSCULAR | Status: DC | PRN
  Administered 2012-07-25: 0.6 mg via INTRAVENOUS

## 2012-07-25 MED ORDER — KETOROLAC TROMETHAMINE 30 MG/ML IJ SOLN
INTRAMUSCULAR | Status: AC
  Filled 2012-07-25: qty 1

## 2012-07-25 MED ORDER — MIDAZOLAM HCL 5 MG/5ML IJ SOLN
INTRAMUSCULAR | Status: DC | PRN
  Administered 2012-07-25: 2 mg via INTRAVENOUS

## 2012-07-25 MED ORDER — KETOROLAC TROMETHAMINE 30 MG/ML IJ SOLN: 15.0000 mg | Freq: Once | INTRAMUSCULAR | Status: DC | PRN

## 2012-07-25 MED ORDER — BUPIVACAINE HCL (PF) 0.25 % IJ SOLN
INTRAMUSCULAR | Status: DC | PRN
  Administered 2012-07-25: 4 mL

## 2012-07-25 MED ORDER — DEXAMETHASONE SODIUM PHOSPHATE 4 MG/ML IJ SOLN
INTRAMUSCULAR | Status: DC | PRN
  Administered 2012-07-25: 10 mg via INTRAVENOUS

## 2012-07-25 MED ORDER — ROCURONIUM BROMIDE 50 MG/5ML IV SOLN
INTRAVENOUS | Status: AC
  Filled 2012-07-25: qty 1

## 2012-07-25 MED ORDER — LIDOCAINE HCL (CARDIAC) 20 MG/ML IV SOLN
INTRAVENOUS | Status: AC
  Filled 2012-07-25: qty 5

## 2012-07-25 MED ORDER — OXYCODONE-ACETAMINOPHEN 10-325 MG PO TABS: 1.0000 | ORAL_TABLET | ORAL | Status: DC | PRN

## 2012-07-25 MED ORDER — ONDANSETRON HCL 4 MG/2ML IJ SOLN
INTRAMUSCULAR | Status: DC | PRN
  Administered 2012-07-25: 4 mg via INTRAVENOUS

## 2012-07-25 MED ORDER — PROPOFOL 10 MG/ML IV BOLUS
INTRAVENOUS | Status: DC | PRN
  Administered 2012-07-25: 200 mg via INTRAVENOUS

## 2012-07-25 SURGICAL SUPPLY — 16 items
ADH SKN CLS APL DERMABOND .7 (GAUZE/BANDAGES/DRESSINGS) ×2
CATH ROBINSON RED A/P 16FR (CATHETERS) ×3 IMPLANT
CLIP FILSHIE TUBAL LIGA STRL (Clip) ×3 IMPLANT
CLOTH BEACON ORANGE TIMEOUT ST (SAFETY) ×3 IMPLANT
DERMABOND ADVANCED (GAUZE/BANDAGES/DRESSINGS) ×1
DERMABOND ADVANCED .7 DNX12 (GAUZE/BANDAGES/DRESSINGS) ×2 IMPLANT
GLOVE ECLIPSE 7.0 STRL STRAW (GLOVE) ×6 IMPLANT
GOWN PREVENTION PLUS LG XLONG (DISPOSABLE) ×3 IMPLANT
GOWN PREVENTION PLUS XLARGE (GOWN DISPOSABLE) ×3 IMPLANT
PACK LAPAROSCOPY BASIN (CUSTOM PROCEDURE TRAY) ×3 IMPLANT
SUT VICRYL 0 UR6 27IN ABS (SUTURE) ×3 IMPLANT
SUT VICRYL RAPIDE 4/0 PS 2 (SUTURE) ×3 IMPLANT
TOWEL OR 17X24 6PK STRL BLUE (TOWEL DISPOSABLE) ×6 IMPLANT
TROCAR BALLN 12MMX100 BLUNT (TROCAR) ×3 IMPLANT
WARMER LAPAROSCOPE (MISCELLANEOUS) ×3 IMPLANT
WATER STERILE IRR 1000ML POUR (IV SOLUTION) ×3 IMPLANT

## 2012-07-25 NOTE — Anesthesia Preprocedure Evaluation (Addendum)
Anesthesia Evaluation  Patient identified by MRN, date of birth, ID band Patient awake    Reviewed: Allergy & Precautions, H&P , NPO status , Patient's Chart, lab work & pertinent test results, reviewed documented beta blocker date and time   Airway Mallampati: II TM Distance: >3 FB Neck ROM: full    Dental  (+) Teeth Intact   Pulmonary neg pulmonary ROS, neg shortness of breath,  breath sounds clear to auscultation  Pulmonary exam normal       Cardiovascular Exercise Tolerance: Good negative cardio ROS  - Valvular Problems/MurmursRhythm:regular Rate:Normal     Neuro/Psych negative neurological ROS  negative psych ROS   GI/Hepatic negative GI ROS, Neg liver ROS,   Endo/Other  BMI 33.7  Renal/GU negative Renal ROS  negative genitourinary   Musculoskeletal   Abdominal   Peds  Hematology negative hematology ROS (+)   Anesthesia Other Findings   Reproductive/Obstetrics negative OB ROS                          Anesthesia Physical Anesthesia Plan  ASA: II  Anesthesia Plan: General ETT   Post-op Pain Management:    Induction:   Airway Management Planned:   Additional Equipment:   Intra-op Plan:   Post-operative Plan:   Informed Consent: I have reviewed the patients History and Physical, chart, labs and discussed the procedure including the risks, benefits and alternatives for the proposed anesthesia with the patient or authorized representative who has indicated his/her understanding and acceptance.   Dental Advisory Given  Plan Discussed with: CRNA and Surgeon  Anesthesia Plan Comments:         Anesthesia Quick Evaluation

## 2012-07-25 NOTE — Transfer of Care (Signed)
Immediate Anesthesia Transfer of Care Note  Patient: Breanna [name]  Procedure(s) Performed: Procedure(s) with comments: LAPAROSCOPIC TUBAL LIGATION (Left) - with filshie clip INTRAUTERINE DEVICE (IUD) REMOVAL (N/A)  Patient Location: PACU  Anesthesia Type:General  Level of Consciousness: awake, sedated and patient cooperative  Airway & Oxygen Therapy: Patient Spontanous Breathing and Patient connected to nasal cannula oxygen  Post-op Assessment: Report given to PACU RN and Post -op Vital signs reviewed and stable  Post vital signs: Reviewed and stable  Complications: No apparent anesthesia complications

## 2012-07-25 NOTE — Op Note (Signed)
NAMEEMERSON, BARRETTO NO.:  [id number]  MEDICAL RECORD NO.:  [id number]  LOCATION:  [facility]                          FACILITY:  [facility]  PHYSICIAN:  Malva Limes, M.D.    DATE OF BIRTH:  [birth year]  DATE OF PROCEDURE: DATE OF DISCHARGE:                              OPERATIVE REPORT   PREOPERATIVE DIAGNOSIS:  Patient desires permanent sterilization.  POSTOPERATIVE DIAGNOSIS:  Patient desires permanent sterilization.  PROCEDURES: 1. Laparoscopic tubal ligation. 2. Removal of IUD.  SURGEON:  Malva Limes, M.D.  ANESTHESIA:  General with local.  ANTIBIOTICS:  Ancef 2 g.  DRAINS:  Red rubber catheter in the bladder.  SPECIMENS:  None.  COMPLICATIONS:  None.  FINDINGS:  The patient had a surgically absent left fallopian tube.  She had no evidence of any pelvic endometriosis or adhesions.  The remaining abdomen appeared to be normal.  Gallbladder appeared to be normal.  The appendix was not visualized.  PROCEDURE IN DETAIL:  The patient was taken to the operating room, where she was placed in a dorsal supine position.  A general anesthetic was administered without difficulty.  She was then placed in dorsal lithotomy position.  She was prepped and draped in usual fashion for this procedure.  The patient did have a desire for her IUD to be removed.  A sterile speculum was placed in the vagina.  There were no strings visible in the cervical os.  Stone forceps were placed into the uterine cavity and the IUD removed.  A Hulka tenaculum was then placed into the anterior cervical lip.  At this point, her bladder was drained with minimal urine being removed.  Her umbilicus was then injected with 0.25% Marcaine.  A vertical skin incision was made through the previous scar.  The fascia was grasped with a tenaculum and opened with Metzenbaum scissors.  Parietal peritoneum was entered bluntly.  A Hasson cannula was placed in the abdominal cavity, and the patient was  placed in Trendelenburg.  Three liter of  carbon dioxide was insufflated.  Exam under anesthesia revealed no evidence of any pelvic adhesions or endometriosis.  The patient had a previous salpingectomy on the right. There was no evidence of any fallopian tube.  The ovary on the right appeared normal.  At this point, the Filshie clip was placed in the isthmic portion of the left fallopian tube.  The clip was placed perpendicular to the tube.  The tube appeared to be entirely within the clasp.  The clasp appeared to be tightly closed.  At this point, the instruments were removed.  Pneumoperitoneum released.  Hasson cannula removed.  The fascia was closed with interrupted 0 Vicryl suture.  The skin was closed with 4-0 Vicryl suture and Dermabond.  The patient was awoken and taken to recovery room in stable condition.  Instrument and lap counts were correct x2.  The patient will be discharged to home with Percocet and she will follow up in the office in 4 weeks.          ______________________________ Malva Limes, M.D.     MA/MEDQ  D:  07/25/2012  T:  07/25/2012  Job:  [id number]

## 2012-07-25 NOTE — H&P (Signed)
Pt is a 37 year old black female who presents to the OR for a tubal ligation. Pt understands that it is permanent. She has had a unilateral salpingectomy from an ectopic in past. She understands the failure rate. PE: VSSAF        HEENT-wnl        Abd- obese, non tender, no masses        Pelvic-deferred to the OR IMP/ Pt desires permanent sterilization PLAN/ Proceed with tubal sterilization.

## 2012-07-25 NOTE — Anesthesia Postprocedure Evaluation (Signed)
Anesthesia Post Note  Patient: Breanna [name]  Procedure(s) Performed: Procedure(s) (LRB): LAPAROSCOPIC TUBAL LIGATION (Left) INTRAUTERINE DEVICE (IUD) REMOVAL (N/A)  Anesthesia type: General  Patient location: PACU  Post pain: Pain level controlled  Post assessment: Post-op Vital signs reviewed  Last Vitals:  Filed Vitals:   07/25/12 0819  BP: 103/83  Pulse: 84  Temp: 36.6 C  Resp: 18    Post vital signs: Reviewed  Level of consciousness: sedated  Complications: No apparent anesthesia complications

## 2012-07-26 ENCOUNTER — Encounter (HOSPITAL_COMMUNITY): Payer: Self-pay | Admitting: Obstetrics and Gynecology

## 2013-06-04 ENCOUNTER — Other Ambulatory Visit: Payer: Self-pay | Admitting: Obstetrics and Gynecology

## 2014-05-04 ENCOUNTER — Other Ambulatory Visit: Payer: Self-pay | Admitting: Orthopedic Surgery

## 2014-05-04 DIAGNOSIS — M25511 Pain in right shoulder: Secondary | ICD-10-CM

## 2014-05-23 ENCOUNTER — Ambulatory Visit
Admission: RE | Admit: 2014-05-23 | Discharge: 2014-05-23 | Disposition: A | Discharge status: Home / Self Care | Payer: Self-pay | Source: Ambulatory Visit | Attending: Orthopedic Surgery | Admitting: Orthopedic Surgery

## 2014-05-23 DIAGNOSIS — M25511 Pain in right shoulder: Secondary | ICD-10-CM

## 2014-07-08 DIAGNOSIS — M24111 Other articular cartilage disorders, right shoulder: Secondary | ICD-10-CM

## 2014-07-08 DIAGNOSIS — M7551 Bursitis of right shoulder: Secondary | ICD-10-CM

## 2014-07-08 HISTORY — DX: Bursitis of right shoulder: M75.51

## 2014-07-08 HISTORY — DX: Other articular cartilage disorders, right shoulder: M24.111

## 2014-07-22 ENCOUNTER — Ambulatory Visit: Payer: Self-pay | Admitting: Physician Assistant

## 2014-07-22 NOTE — H&P (Signed)
Breanna [name] is an 39 y.o. female.   Chief Complaint: right shoulder pain HPI: 39yo female several month hx of right shoulder pain, failing conservative treatments injection, po meds, PT.  MRI negative for tear did show mild impingement and tendinopathy.  Past Medical History  Diagnosis Date  . Complication of anesthesia     failed epidural 2010  . Medical history non-contributory     Past Surgical History  Procedure Laterality Date  . Cesarean section      x3  . Diagnostic laparoscopy    . Ankle surgery    . Laparoscopy for ectopic pregnancy    . Laparoscopic tubal ligation Left 07/25/2012    Procedure: LAPAROSCOPIC TUBAL LIGATION;  Surgeon: Mark E Anderson, MD;  Location: [facility];  Service: Gynecology;  Laterality: Left;  with filshie clip  . Iud removal N/A 07/25/2012    Procedure: INTRAUTERINE DEVICE (IUD) REMOVAL;  Surgeon: Mark E Anderson, MD;  Location: [facility];  Service: Gynecology;  Laterality: N/A;    No family history on file. Social History:  reports that she has never smoked. She does not have any smokeless tobacco history on file. She reports that she does not drink alcohol or use illicit drugs.  Allergies: No Known Allergies   (Not in a hospital admission)  No results found for this or any previous visit (from the past 48 hour(s)). No results found.  Review of Systems  Musculoskeletal: Positive for myalgias and joint pain. Negative for falls.  All other systems reviewed and are negative.   There were no vitals taken for this visit. Physical Exam  Constitutional: She is oriented to person, place, and time. She appears well-developed and well-nourished. No distress.  HENT:  Head: Normocephalic and atraumatic.  Nose: Nose normal.  Eyes: Conjunctivae and EOM are normal. Pupils are equal, round, and reactive to light.  Neck: Normal range of motion. Neck supple.  Cardiovascular: Normal rate and intact distal pulses.   Respiratory: Effort normal. No respiratory  distress. She has no wheezes.  GI: Soft. She exhibits no distension. There is no tenderness.  Musculoskeletal:       Right shoulder: She exhibits decreased range of motion, tenderness, bony tenderness and pain. She exhibits no deformity and normal strength.  Neurological: She is alert and oriented to person, place, and time.  Skin: Skin is warm and dry. No rash noted. No erythema.  Psychiatric: She has a normal mood and affect. Her behavior is normal.     Assessment/Plan Right shoulder impingement and pain  Certainly there is nothing that compels us to mandate surgery.  However, based on the long duration of symptoms and the lack of response to conservative treatment, I think she is a reasonable candidate for limited debridement and acromioplasty.  I told her the threshold to do this at this point is up to her.  No guarantee could be given, but I would expect since the xylocaine test was positive in the sense of helping her that we would gain some improvement from an acromioplasty and debridement.  Surgery would be as an outpatient under general with a nerve block.  We would want to do therapy certainly within about five days to minimize stiffness.   Hiep Ollis 07/22/2014, 4:48 PM    

## 2014-07-24 ENCOUNTER — Encounter (HOSPITAL_BASED_OUTPATIENT_CLINIC_OR_DEPARTMENT_OTHER): Payer: Self-pay | Admitting: *Deleted

## 2014-07-29 ENCOUNTER — Encounter (HOSPITAL_BASED_OUTPATIENT_CLINIC_OR_DEPARTMENT_OTHER)
Admission: RE | Disposition: A | Discharge status: Home / Self Care | Payer: Self-pay | Source: Ambulatory Visit | Attending: Orthopedic Surgery

## 2014-07-29 ENCOUNTER — Ambulatory Visit (HOSPITAL_BASED_OUTPATIENT_CLINIC_OR_DEPARTMENT_OTHER)
Admission: RE | Admit: 2014-07-29 | Discharge: 2014-07-29 | Disposition: A | Discharge status: Home / Self Care | Payer: 59 | Source: Ambulatory Visit | Attending: Orthopedic Surgery | Admitting: Orthopedic Surgery

## 2014-07-29 ENCOUNTER — Ambulatory Visit (HOSPITAL_BASED_OUTPATIENT_CLINIC_OR_DEPARTMENT_OTHER): Payer: 59 | Admitting: Anesthesiology

## 2014-07-29 ENCOUNTER — Encounter (HOSPITAL_BASED_OUTPATIENT_CLINIC_OR_DEPARTMENT_OTHER): Payer: Self-pay | Admitting: Anesthesiology

## 2014-07-29 DIAGNOSIS — J45909 Unspecified asthma, uncomplicated: Secondary | ICD-10-CM | POA: Diagnosis not present

## 2014-07-29 DIAGNOSIS — R51 Headache: Secondary | ICD-10-CM | POA: Insufficient documentation

## 2014-07-29 DIAGNOSIS — M7541 Impingement syndrome of right shoulder: Secondary | ICD-10-CM | POA: Diagnosis not present

## 2014-07-29 DIAGNOSIS — M7551 Bursitis of right shoulder: Secondary | ICD-10-CM | POA: Insufficient documentation

## 2014-07-29 HISTORY — DX: Anemia, unspecified: D64.9

## 2014-07-29 HISTORY — DX: Personal history of other specified conditions: Z87.898

## 2014-07-29 HISTORY — DX: Other articular cartilage disorders, right shoulder: M24.111

## 2014-07-29 HISTORY — DX: Bursitis of right shoulder: M75.51

## 2014-07-29 HISTORY — PX: SHOULDER ARTHROSCOPY WITH SUBACROMIAL DECOMPRESSION: SHX5684

## 2014-07-29 LAB — POCT HEMOGLOBIN-HEMACUE: Hemoglobin: 12.6 g/dL (ref 12.0–15.0)

## 2014-07-29 SURGERY — SHOULDER ARTHROSCOPY WITH SUBACROMIAL DECOMPRESSION
Anesthesia: Regional | Site: Shoulder | Laterality: Right

## 2014-07-29 MED ORDER — ONDANSETRON HCL 4 MG/2ML IJ SOLN
INTRAMUSCULAR | Status: DC | PRN
  Administered 2014-07-29: 4 mg via INTRAVENOUS

## 2014-07-29 MED ORDER — PROMETHAZINE HCL 25 MG/ML IJ SOLN
INTRAMUSCULAR | Status: AC
  Filled 2014-07-29: qty 1

## 2014-07-29 MED ORDER — CEFAZOLIN SODIUM-DEXTROSE 2-3 GM-% IV SOLR: 2.0000 g | INTRAVENOUS | Status: DC

## 2014-07-29 MED ORDER — BUPIVACAINE-EPINEPHRINE (PF) 0.5% -1:200000 IJ SOLN
INTRAMUSCULAR | Status: DC | PRN
  Administered 2014-07-29: 25 mL via PERINEURAL

## 2014-07-29 MED ORDER — SCOPOLAMINE 1 MG/3DAYS TD PT72: 1.0000 | MEDICATED_PATCH | Freq: Once | TRANSDERMAL | Status: DC | PRN

## 2014-07-29 MED ORDER — METHOCARBAMOL 500 MG PO TABS: 500.0000 mg | ORAL_TABLET | Freq: Four times a day (QID) | ORAL | Status: DC | PRN

## 2014-07-29 MED ORDER — FENTANYL CITRATE (PF) 100 MCG/2ML IJ SOLN
INTRAMUSCULAR | Status: AC
  Filled 2014-07-29: qty 2

## 2014-07-29 MED ORDER — PROMETHAZINE HCL 25 MG/ML IJ SOLN: 6.2500 mg | INTRAMUSCULAR | Status: DC | PRN

## 2014-07-29 MED ORDER — PROPOFOL 10 MG/ML IV BOLUS
INTRAVENOUS | Status: DC | PRN
  Administered 2014-07-29: 200 mg via INTRAVENOUS

## 2014-07-29 MED ORDER — DEXAMETHASONE SODIUM PHOSPHATE 4 MG/ML IJ SOLN
INTRAMUSCULAR | Status: DC | PRN
  Administered 2014-07-29: 10 mg via INTRAVENOUS

## 2014-07-29 MED ORDER — LIDOCAINE HCL (CARDIAC) 20 MG/ML IV SOLN
INTRAVENOUS | Status: DC | PRN
  Administered 2014-07-29: 60 mg via INTRAVENOUS

## 2014-07-29 MED ORDER — EPINEPHRINE HCL 1 MG/ML IJ SOLN
INTRAMUSCULAR | Status: DC | PRN
  Administered 2014-07-29: 4000 mL

## 2014-07-29 MED ORDER — FENTANYL CITRATE (PF) 100 MCG/2ML IJ SOLN
50.0000 ug | INTRAMUSCULAR | Status: AC | PRN
  Administered 2014-07-29: 25 ug via INTRAVENOUS
  Administered 2014-07-29: 100 ug via INTRAVENOUS
  Administered 2014-07-29: 25 ug via INTRAVENOUS

## 2014-07-29 MED ORDER — CEFAZOLIN SODIUM-DEXTROSE 2-3 GM-% IV SOLR
2.0000 g | INTRAVENOUS | Status: AC
  Administered 2014-07-29: 2 g via INTRAVENOUS

## 2014-07-29 MED ORDER — MIDAZOLAM HCL 2 MG/2ML IJ SOLN
1.0000 mg | INTRAMUSCULAR | Status: DC | PRN
  Administered 2014-07-29: 2 mg via INTRAVENOUS

## 2014-07-29 MED ORDER — SUCCINYLCHOLINE CHLORIDE 20 MG/ML IJ SOLN
INTRAMUSCULAR | Status: DC | PRN
  Administered 2014-07-29: 100 mg via INTRAVENOUS

## 2014-07-29 MED ORDER — MIDAZOLAM HCL 2 MG/2ML IJ SOLN
INTRAMUSCULAR | Status: AC
  Filled 2014-07-29: qty 2

## 2014-07-29 MED ORDER — CEFAZOLIN SODIUM-DEXTROSE 2-3 GM-% IV SOLR
INTRAVENOUS | Status: AC
  Filled 2014-07-29: qty 50

## 2014-07-29 MED ORDER — SODIUM CHLORIDE 0.9 % IV SOLN: INTRAVENOUS | Status: DC

## 2014-07-29 MED ORDER — HYDROMORPHONE HCL 1 MG/ML IJ SOLN: 0.2500 mg | INTRAMUSCULAR | Status: DC | PRN

## 2014-07-29 MED ORDER — CHLORHEXIDINE GLUCONATE 4 % EX LIQD: 60.0000 mL | Freq: Once | CUTANEOUS | Status: DC

## 2014-07-29 MED ORDER — LACTATED RINGERS IV SOLN
INTRAVENOUS | Status: DC
  Administered 2014-07-29 (×2): via INTRAVENOUS

## 2014-07-29 MED ORDER — LIDOCAINE HCL 4 % MT SOLN
OROMUCOSAL | Status: DC | PRN
  Administered 2014-07-29: 3 mL via TOPICAL

## 2014-07-29 MED ORDER — GLYCOPYRROLATE 0.2 MG/ML IJ SOLN: 0.2000 mg | Freq: Once | INTRAMUSCULAR | Status: DC | PRN

## 2014-07-29 MED ORDER — PHENYLEPHRINE HCL 10 MG/ML IJ SOLN
INTRAMUSCULAR | Status: DC | PRN
  Administered 2014-07-29: 80 ug via INTRAVENOUS

## 2014-07-29 MED ORDER — OXYCODONE-ACETAMINOPHEN 5-325 MG PO TABS: 1.0000 | ORAL_TABLET | ORAL | Status: DC | PRN

## 2014-07-29 SURGICAL SUPPLY — 78 items
APL SKNCLS STERI-STRIP NONHPOA (GAUZE/BANDAGES/DRESSINGS)
BENZOIN TINCTURE PRP APPL 2/3 (GAUZE/BANDAGES/DRESSINGS) IMPLANT
BLADE 4.2CUDA (BLADE) ×2 IMPLANT
BLADE AVERAGE 25X9 (BLADE) IMPLANT
BLADE CUTTER GATOR 3.5 (BLADE) IMPLANT
BLADE SURG 15 STRL LF DISP TIS (BLADE) IMPLANT
BLADE SURG 15 STRL SS (BLADE)
BLADE VORTEX 6.0 (BLADE) ×2 IMPLANT
BUR 3.5 LG SPHERICAL (BURR) IMPLANT
BUR EGG 3PK/BX (BURR) IMPLANT
BUR OVAL 4.0 (BURR) IMPLANT
BUR OVAL 6.0 (BURR) ×2 IMPLANT
BUR VERTEX HOODED 4.5 (BURR) IMPLANT
BURR 3.5 LG SPHERICAL (BURR)
CANNULA SHOULDER 7CM (CANNULA) ×2 IMPLANT
CANNULA TWIST IN 8.25X7CM (CANNULA) IMPLANT
CLEANER CAUTERY TIP 5X5 PAD (MISCELLANEOUS) IMPLANT
CUTTER MENISCUS  4.2MM (BLADE) ×1
CUTTER MENISCUS 4.2MM (BLADE) ×1 IMPLANT
DECANTER SPIKE VIAL GLASS SM (MISCELLANEOUS) IMPLANT
DRAPE STERI 35X30 U-POUCH (DRAPES) ×2 IMPLANT
DRAPE SURG 17X23 STRL (DRAPES) ×2 IMPLANT
DRAPE U-SHAPE 76X120 STRL (DRAPES) ×4 IMPLANT
DRSG EMULSION OIL 3X3 NADH (GAUZE/BANDAGES/DRESSINGS) ×2 IMPLANT
DRSG PAD ABDOMINAL 8X10 ST (GAUZE/BANDAGES/DRESSINGS) ×2 IMPLANT
DURAPREP 26ML APPLICATOR (WOUND CARE) ×2 IMPLANT
ELECT REM PT RETURN 9FT ADLT (ELECTROSURGICAL) ×2
ELECTRODE REM PT RTRN 9FT ADLT (ELECTROSURGICAL) ×1 IMPLANT
GAUZE SPONGE 4X4 12PLY STRL (GAUZE/BANDAGES/DRESSINGS) ×2 IMPLANT
GLOVE BIO SURGEON STRL SZ 6.5 (GLOVE) ×2 IMPLANT
GLOVE BIO SURGEON STRL SZ7.5 (GLOVE) ×2 IMPLANT
GLOVE BIOGEL PI IND STRL 7.0 (GLOVE) ×2 IMPLANT
GLOVE BIOGEL PI IND STRL 8 (GLOVE) ×3 IMPLANT
GLOVE BIOGEL PI INDICATOR 7.0 (GLOVE) ×2
GLOVE BIOGEL PI INDICATOR 8 (GLOVE) ×3
GLOVE SURG ORTHO 8.0 STRL STRW (GLOVE) ×2 IMPLANT
GOWN STRL REUS W/ TWL LRG LVL3 (GOWN DISPOSABLE) ×1 IMPLANT
GOWN STRL REUS W/ TWL XL LVL3 (GOWN DISPOSABLE) ×1 IMPLANT
GOWN STRL REUS W/TWL LRG LVL3 (GOWN DISPOSABLE) ×1
GOWN STRL REUS W/TWL XL LVL3 (GOWN DISPOSABLE) ×4 IMPLANT
MANIFOLD NEPTUNE II (INSTRUMENTS) ×2 IMPLANT
NEEDLE 1/2 CIR CATGUT .05X1.09 (NEEDLE) IMPLANT
NEEDLE SCORPION MULTI FIRE (NEEDLE) IMPLANT
NS IRRIG 1000ML POUR BTL (IV SOLUTION) ×2 IMPLANT
PACK ARTHROSCOPY DSU (CUSTOM PROCEDURE TRAY) ×2 IMPLANT
PACK BASIN DAY SURGERY FS (CUSTOM PROCEDURE TRAY) ×2 IMPLANT
PAD CLEANER CAUTERY TIP 5X5 (MISCELLANEOUS)
PAD ORTHO SHOULDER 7X19 LRG (SOFTGOODS) ×2 IMPLANT
PENCIL BUTTON HOLSTER BLD 10FT (ELECTRODE) IMPLANT
SET ARTHROSCOPY TUBING (MISCELLANEOUS) ×2
SET ARTHROSCOPY TUBING LN (MISCELLANEOUS) ×1 IMPLANT
SLING ARM LRG ADULT FOAM STRAP (SOFTGOODS) IMPLANT
SLING ARM MED ADULT FOAM STRAP (SOFTGOODS) IMPLANT
SLING ULTRA II MEDIUM (SOFTGOODS) IMPLANT
SLING ULTRA II SMALL (SOFTGOODS) IMPLANT
SPONGE LAP 4X18 X RAY DECT (DISPOSABLE) IMPLANT
STAPLER VISISTAT 35W (STAPLE) IMPLANT
STRIP CLOSURE SKIN 1/2X4 (GAUZE/BANDAGES/DRESSINGS) IMPLANT
SUCTION FRAZIER TIP 10 FR DISP (SUCTIONS) IMPLANT
SUT BONE WAX W31G (SUTURE) IMPLANT
SUT ETHILON 4 0 PS 2 18 (SUTURE) ×2 IMPLANT
SUT FIBERWIRE #2 38 T-5 BLUE (SUTURE)
SUT MNCRL AB 3-0 PS2 18 (SUTURE) IMPLANT
SUT PROLENE 3 0 PS 2 (SUTURE) IMPLANT
SUT TICRON 1 T 12 (SUTURE) IMPLANT
SUT TIGER TAPE 7 IN WHITE (SUTURE) IMPLANT
SUT VIC AB 0 CT1 27 (SUTURE)
SUT VIC AB 0 CT1 27XBRD ANBCTR (SUTURE) IMPLANT
SUT VIC AB 1 CT1 27 (SUTURE)
SUT VIC AB 1 CT1 27XBRD ANBCTR (SUTURE) IMPLANT
SUT VIC AB 2-0 SH 27 (SUTURE)
SUT VIC AB 2-0 SH 27XBRD (SUTURE) IMPLANT
SUTURE FIBERWR #2 38 T-5 BLUE (SUTURE) IMPLANT
TAPE FIBER 2MM 7IN #2 BLUE (SUTURE) IMPLANT
TOWEL OR 17X24 6PK STRL BLUE (TOWEL DISPOSABLE) ×2 IMPLANT
WAND STAR VAC 90 (SURGICAL WAND) ×2 IMPLANT
WATER STERILE IRR 1000ML POUR (IV SOLUTION) ×2 IMPLANT
YANKAUER SUCT BULB TIP NO VENT (SUCTIONS) IMPLANT

## 2014-07-29 NOTE — Anesthesia Preprocedure Evaluation (Signed)
Anesthesia Evaluation  Patient identified by MRN, date of birth, ID band Patient awake    Reviewed: Allergy & Precautions, NPO status , Patient's Chart, lab work & pertinent test results  History of Anesthesia Complications Negative for: history of anesthetic complications  Airway Mallampati: II  TM Distance: <3 FB Neck ROM: Full    Dental  (+) Teeth Intact   Pulmonary shortness of breath, asthma ,  breath sounds clear to auscultation        Cardiovascular Rhythm:Regular Rate:Normal     Neuro/Psych  Headaches,    GI/Hepatic negative GI ROS, Neg liver ROS,   Endo/Other  negative endocrine ROS  Renal/GU negative Renal ROS     Musculoskeletal   Abdominal   Peds  Hematology negative hematology ROS (+)   Anesthesia Other Findings   Reproductive/Obstetrics                             Anesthesia Physical Anesthesia Plan  ASA: II  Anesthesia Plan: General   Post-op Pain Management:    Induction: Intravenous  Airway Management Planned: Oral ETT  Additional Equipment:   Intra-op Plan:   Post-operative Plan: Extubation in OR  Informed Consent: I have reviewed the patients History and Physical, chart, labs and discussed the procedure including the risks, benefits and alternatives for the proposed anesthesia with the patient or authorized representative who has indicated his/her understanding and acceptance.   Dental advisory given  Plan Discussed with: CRNA and Surgeon  Anesthesia Plan Comments:         Anesthesia Quick Evaluation

## 2014-07-29 NOTE — Anesthesia Procedure Notes (Addendum)
Anesthesia Regional Block:  Interscalene brachial plexus block  Pre-Anesthetic Checklist: ,, timeout performed, Correct Patient, Correct Site, Correct Laterality, Correct Procedure, Correct Position, site marked, Risks and benefits discussed,  Surgical consent,  Pre-op evaluation,  At surgeon's request and post-op pain management  Laterality: Upper and Right  Prep: chloraprep and alcohol swabs       Needles:  Injection technique: Single-shot  Needle Type: Stimulator Needle - 40     Needle Length: 9cm 9 cm Needle Gauge: 22 and 22 G  Needle insertion depth: 4 cm   Additional Needles:  Procedures: ultrasound guided (picture in chart) and nerve stimulator Interscalene brachial plexus block  Nerve Stimulator or Paresthesia:  Response: Twitch elicited, 0.5 mA, 0.3 ms,   Additional Responses:   Narrative:  Start time: 07/29/2014 11:35 AM End time: 07/29/2014 11:50 AM Injection made incrementally with aspirations every 5 mL.  Performed by: Personally  Anesthesiologist: MASSAGEE, TERRY  Additional Notes: Block assessed prior to start of surgery   Procedure Name: Intubation Date/Time: 07/29/2014 12:17 PM Performed by: Burna Cash Pre-anesthesia Checklist: Patient identified, Emergency Drugs available, Suction available and Patient being monitored Patient Re-evaluated:Patient Re-evaluated prior to inductionOxygen Delivery Method: Circle System Utilized Preoxygenation: Pre-oxygenation with 100% oxygen Intubation Type: IV induction Ventilation: Mask ventilation without difficulty Laryngoscope Size: Mac and 3 Grade View: Grade II Tube type: Oral Tube size: 7.0 mm Number of attempts: 1 Airway Equipment and Method: Stylet and Oral airway Placement Confirmation: ETT inserted through vocal cords under direct vision,  positive ETCO2 and breath sounds checked- equal and bilateral Secured at: 21 cm Tube secured with: Tape Dental Injury: Teeth and Oropharynx as per pre-operative  assessment

## 2014-07-29 NOTE — Brief Op Note (Signed)
07/29/2014  1:02 PM  PATIENT:  Breanna [name]  39 y.o. female  PRE-OPERATIVE DIAGNOSIS:  OTHER ARTICULAR CARTILAGE DISORDERS RIGHT SHOULDER/BURSITIS OF RIGHT SHOULDER  POST-OPERATIVE DIAGNOSIS:  OTHER ARTICULAR CARTILAGE DISORDERS RIGHT   PROCEDURE:  Procedure(s) with comments: RIGHT SHOULDER ARTHROSCOPY WITH DEBRIDEMENT  and Acromioplasty (Right) - ANESTHESIA:  GENERAL, PRE/POST OP SCALENE  SURGEON:  Surgeon(s) and Role:    * Frederico Hamman, MD - Primary  PHYSICIAN ASSISTANT: Margart Sickles, PA-C  ASSISTANTS:   ANESTHESIA:   regional and general  EBL:  Total I/O In: 1000 [I.V.:1000] Out: -   BLOOD ADMINISTERED:none  DRAINS: none   LOCAL MEDICATIONS USED:  NONE  SPECIMEN:  No Specimen  DISPOSITION OF SPECIMEN:  N/A  COUNTS:  YES  TOURNIQUET:  * No tourniquets in log *  DICTATION: .Other Dictation: Dictation Number unknown  PLAN OF CARE: Discharge to home after PACU  PATIENT DISPOSITION:  PACU - hemodynamically stable.   Delay start of Pharmacological VTE agent (>24hrs) due to surgical blood loss or risk of bleeding: not applicable

## 2014-07-29 NOTE — Anesthesia Postprocedure Evaluation (Signed)
  Anesthesia Post-op Note  Patient: Breanna [name]  Procedure(s) Performed: Procedure(s) with comments: RIGHT SHOULDER ARTHROSCOPY WITH DEBRIDEMENT  and Acromioplasty (Right) - ANESTHESIA:  GENERAL, PRE/POST OP SCALENE  Patient Location: PACU  Anesthesia Type:GA combined with regional for post-op pain  Level of Consciousness: awake and alert   Airway and Oxygen Therapy: Patient Spontanous Breathing  Post-op Pain: none  Post-op Assessment: Post-op Vital signs reviewed              Post-op Vital Signs: stable  Last Vitals:  Filed Vitals:   07/29/14 1415  BP: 104/65  Pulse: 72  Temp:   Resp: 19    Complications: No apparent anesthesia complications

## 2014-07-29 NOTE — H&P (View-Only) (Signed)
Breanna [name] is an 39 y.o. female.   Chief Complaint: right shoulder pain HPI: 39yo female several month hx of right shoulder pain, failing conservative treatments injection, po meds, PT.  MRI negative for tear did show mild impingement and tendinopathy.  Past Medical History  Diagnosis Date  . Complication of anesthesia     failed epidural 2010  . Medical history non-contributory     Past Surgical History  Procedure Laterality Date  . Cesarean section      x3  . Diagnostic laparoscopy    . Ankle surgery    . Laparoscopy for ectopic pregnancy    . Laparoscopic tubal ligation Left 07/25/2012    Procedure: LAPAROSCOPIC TUBAL LIGATION;  Surgeon: Levi Aland, MD;  Location: [facility];  Service: Gynecology;  Laterality: Left;  with filshie clip  . Iud removal N/A 07/25/2012    Procedure: INTRAUTERINE DEVICE (IUD) REMOVAL;  Surgeon: Levi Aland, MD;  Location: [facility];  Service: Gynecology;  Laterality: N/A;    No family history on file. Social History:  reports that she has never smoked. She does not have any smokeless tobacco history on file. She reports that she does not drink alcohol or use illicit drugs.  Allergies: No Known Allergies   (Not in a hospital admission)  No results found for this or any previous visit (from the past 48 hour(s)). No results found.  Review of Systems  Musculoskeletal: Positive for myalgias and joint pain. Negative for falls.  All other systems reviewed and are negative.   There were no vitals taken for this visit. Physical Exam  Constitutional: She is oriented to person, place, and time. She appears well-developed and well-nourished. No distress.  HENT:  Head: Normocephalic and atraumatic.  Nose: Nose normal.  Eyes: Conjunctivae and EOM are normal. Pupils are equal, round, and reactive to light.  Neck: Normal range of motion. Neck supple.  Cardiovascular: Normal rate and intact distal pulses.   Respiratory: Effort normal. No respiratory  distress. She has no wheezes.  GI: Soft. She exhibits no distension. There is no tenderness.  Musculoskeletal:       Right shoulder: She exhibits decreased range of motion, tenderness, bony tenderness and pain. She exhibits no deformity and normal strength.  Neurological: She is alert and oriented to person, place, and time.  Skin: Skin is warm and dry. No rash noted. No erythema.  Psychiatric: She has a normal mood and affect. Her behavior is normal.     Assessment/Plan Right shoulder impingement and pain  Certainly there is nothing that compels [place] to mandate surgery.  However, based on the long duration of symptoms and the lack of response to conservative treatment, I think she is a reasonable candidate for limited debridement and acromioplasty.  I told her the threshold to do this at this point is up to her.  No guarantee could be given, but I would expect since the xylocaine test was positive in the sense of helping her that we would gain some improvement from an acromioplasty and debridement.  Surgery would be as an outpatient under general with a nerve block.  We would want to do therapy certainly within about five days to minimize stiffness.   Margart Sickles 07/22/2014, 4:48 PM

## 2014-07-29 NOTE — Discharge Instructions (Signed)
Diet: As you were doing prior to hospitalization   Activity: Increase activity slowly as tolerated  No lifting or driving for 48 hours  Shower: May shower without a dressing on post op day #2, NO SOAKING in tub   Dressing: You may change your dressing on post op day #2.  Then change the dressing daily with sterile 4"x4"s gauze dressing or band aids  Weight Bearing: weight bearing as tolerated, sling for 24-48 hours then out of the sling as tolerated.  Range of motion as tolerated.  To prevent constipation: you may use a stool softener such as -  Colace ( over the counter) 100 mg by mouth twice a day  Drink plenty of fluids ( prune juice may be helpful) and high fiber foods  Miralax ( over the counter) for constipation as needed.   Precautions: If you experience chest pain or shortness of breath - call 911 immediately For transfer to the hospital emergency department!!  If you develop a fever greater that 101 F, purulent drainage from wound, increased redness or drainage from wound, or calf pain -- Call the office   Follow- Up Appointment: Please call for an appointment to be seen in 1 week  [place] - [phone]  Regional Anesthesia Blocks   1. Numbness or the inability to move the "blocked" extremity may last from 3-48 hours after placement. The length of time depends on the medication injected and your individual response to the medication. If the numbness is not going away after 48 hours, call your surgeon.  2. The extremity that is blocked will need to be protected until the numbness is gone and the  Strength has returned. Because you cannot feel it, you will need to take extra care to avoid injury. Because it may be weak, you may have difficulty moving it or using it. You may not know what position it is in without looking at it while the block is in effect.  3. For blocks in the legs and feet, returning to weight bearing and walking needs to be done carefully. You will need  to wait until the numbness is entirely gone and the strength has returned. You should be able to move your leg and foot normally before you try and bear weight or walk. You will need someone to be with you when you first try to ensure you do not fall and possibly risk injury.  4. Bruising and tenderness at the needle site are common side effects and will resolve in a few days.  5. Persistent numbness or new problems with movement should be communicated to the surgeon or the [facility] [phone] [facility] [phone]).  Post Anesthesia Home Care Instructions  Activity: Get plenty of rest for the remainder of the day. A responsible adult should stay with you for 24 hours following the procedure.  For the next 24 hours, DO NOT: -Drive a car -Advertising copywriter -Drink alcoholic beverages -Take any medication unless instructed by your physician -Make any legal decisions or sign important papers.  Meals: Start with liquid foods such as gelatin or soup. Progress to regular foods as tolerated. Avoid greasy, spicy, heavy foods. If nausea and/or vomiting occur, drink only clear liquids until the nausea and/or vomiting subsides. Call your physician if vomiting continues.  Special Instructions/Symptoms: Your throat may feel dry or sore from the anesthesia or the breathing tube placed in your throat during surgery. If this causes discomfort, gargle with warm salt water. The discomfort  should disappear within 24 hours.  If you had a scopolamine patch placed behind your ear for the management of post- operative nausea and/or vomiting:  1. The medication in the patch is effective for 72 hours, after which it should be removed.  Wrap patch in a tissue and discard in the trash. Wash hands thoroughly with soap and water. 2. You may remove the patch earlier than 72 hours if you experience unpleasant side effects which may include dry mouth, dizziness or visual  disturbances. 3. Avoid touching the patch. Wash your hands with soap and water after contact with the patch.

## 2014-07-29 NOTE — Interval H&P Note (Signed)
History and Physical Interval Note:  07/29/2014 11:51 AM  Breanna [name]  has presented today for surgery, with the diagnosis of OTHER ARTICULAR CARTILAGE DISORDERS RIGHT SHOULDER/BURSITIS OF RIGHT SHOULDER  The various methods of treatment have been discussed with the patient and family. After consideration of risks, benefits and other options for treatment, the patient has consented to  Procedure(s) with comments: RIGHT SHOULDER ARTHROSCOPY WITH DEBRIDEMENT/SUBACROMIAL DECOMPRESSION (Right) - ANESTHESIA:  GENERAL, PRE/POST OP SCALENE as a surgical intervention .  The patient's history has been reviewed, patient examined, no change in status, stable for surgery.  I have reviewed the patient's chart and labs.  Questions were answered to the patient's satisfaction.     Zyire Eidson JR,W D

## 2014-07-29 NOTE — Progress Notes (Signed)
Assisted Dr. Massagee with right, ultrasound guided, supraclavicular block. Side rails up, monitors on throughout procedure. See vital signs in flow sheet. Tolerated Procedure well. 

## 2014-07-29 NOTE — Transfer of Care (Signed)
Immediate Anesthesia Transfer of Care Note  Patient: Breanna [name]  Procedure(s) Performed: Procedure(s) with comments: RIGHT SHOULDER ARTHROSCOPY WITH DEBRIDEMENT  and Acromioplasty (Right) - ANESTHESIA:  GENERAL, PRE/POST OP SCALENE  Patient Location: PACU  Anesthesia Type:GA combined with regional for post-op pain  Level of Consciousness: awake, alert  and oriented  Airway & Oxygen Therapy: Patient Spontanous Breathing and Patient connected to face mask oxygen  Post-op Assessment: Report given to RN and Post -op Vital signs reviewed and stable  Post vital signs: Reviewed and stable  Last Vitals:  Filed Vitals:   07/29/14 1155  BP:   Pulse: 95  Temp:   Resp:     Complications: No apparent anesthesia complications

## 2014-07-30 ENCOUNTER — Encounter (HOSPITAL_BASED_OUTPATIENT_CLINIC_OR_DEPARTMENT_OTHER): Payer: Self-pay | Admitting: Orthopedic Surgery

## 2014-07-31 NOTE — Op Note (Signed)
NAME:  Breanna [name], ERNSBERGER NO.:  [id number]  MEDICAL RECORD NO.:  [id number]  LOCATION:                               FACILITY:  [facility]  PHYSICIAN:  Dyke Brackett, M.D.    DATE OF BIRTH:  [birth year]  DATE OF PROCEDURE:  07/29/2014 DATE OF DISCHARGE:                              OPERATIVE REPORT   INDICATIONS:  Intractable shoulder pain with rotator cuff tendinopathy with mild impingement findings, not responded to conservative treatment including repetitive injection, DT, thought to be amenable to outpatient surgery.  PREOPERATIVE DIAGNOSES: 1. Partial rotator cuff tear. 2. Impingement.  POSTOPERATIVE DIAGNOSIS: 1. Partial rotator cuff tear. 2. Impingement. 3. Degenerative tearing anterior superior labrum.  OPERATION: 1. Arthroscopic debridement (limited). 2. Arthroscopic acromioplasty, right shoulder.  SURGEON:  Dyke Brackett, M.D.  ASSISTANT:  Margart Sickles, PA-C.  ANESTHESIA:  General with nerve block.  DESCRIPTION OF PROCEDURE:  Examination under anesthesia showed normal range of motion, no instability. __________ through posterolateral, anterior portal __________ resection to glenohumeral joint.  Some very mild degenerative tearing of the anterior superior labrum without instability, which was debrided.  The biceps tendon anchor intact. Subscapularis intact.  There was a partial-thickness tear of the supraspinatus probably about half the width of the supraspinatus depth about 20% to 30% of the fibers, which required only debridement, no full- thickness component appreciated.  After limited debridement of the shoulder intra-articularly, subacromial space was entered.  It was very hypertrophied and inflamed with significant amount of bursitis. Bursectomy carried out.  Very thickened CA ligament with clinical evidence of impingement.  Released the CA ligament.  Abrasion type phenomenon was noted on the superior surface of the cuff.  No  full- thickness tear appreciated.  Did not take the [facility] joint clinically or radiographically was significantly involved, we did not violate the [facility] joint.  Performed acromioplasty relieving the impingement nicely in a complete bursectomy. Shoulder drained free of fluid.  Portals closed with nylon.  Closure was affected with interrupted nylon.  Lightly compressive sterile dressing applied.  Sling.  Taken to the recovery room in stable condition.     Dyke Brackett, M.D.     WDC/MEDQ  D:  07/29/2014  T:  07/29/2014  Job:  [id number]

## 2015-12-23 ENCOUNTER — Encounter (HOSPITAL_COMMUNITY): Payer: Self-pay | Admitting: *Deleted

## 2015-12-23 DIAGNOSIS — Z5321 Procedure and treatment not carried out due to patient leaving prior to being seen by health care provider: Secondary | ICD-10-CM | POA: Insufficient documentation

## 2015-12-23 DIAGNOSIS — R42 Dizziness and giddiness: Secondary | ICD-10-CM | POA: Insufficient documentation

## 2015-12-23 DIAGNOSIS — R110 Nausea: Secondary | ICD-10-CM | POA: Insufficient documentation

## 2015-12-23 MED ORDER — ONDANSETRON 4 MG PO TBDP
ORAL_TABLET | ORAL | Status: AC
  Filled 2015-12-23: qty 1

## 2015-12-23 MED ORDER — ONDANSETRON 4 MG PO TBDP
4.0000 mg | ORAL_TABLET | Freq: Once | ORAL | Status: AC | PRN
Start: 2015-12-23 — End: 2015-12-23
  Administered 2015-12-23: 4 mg via ORAL
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
## 2015-12-24 ENCOUNTER — Emergency Department (HOSPITAL_COMMUNITY)
Admission: EM | Admit: 2015-12-24 | Discharge: 2015-12-24 | Disposition: A | Discharge status: Home / Self Care | Payer: 59 | Attending: Emergency Medicine | Admitting: Emergency Medicine

## 2015-12-24 NOTE — ED Notes (Signed)
Patient didn't answer when called for rechecked vitals

## 2015-12-24 NOTE — ED Notes (Signed)
Patient was called again but no answer

## 2016-06-05 ENCOUNTER — Other Ambulatory Visit: Payer: Self-pay | Admitting: Obstetrics and Gynecology

## 2016-06-07 LAB — CYTOLOGY - PAP

## 2016-06-13 ENCOUNTER — Other Ambulatory Visit: Payer: Self-pay | Admitting: Obstetrics and Gynecology

## 2016-06-15 LAB — CYTOLOGY - PAP

## 2016-06-28 ENCOUNTER — Other Ambulatory Visit: Payer: Self-pay | Admitting: Obstetrics and Gynecology

## 2016-06-29 LAB — CYTOLOGY - PAP

## 2017-09-14 ENCOUNTER — Other Ambulatory Visit: Payer: Self-pay | Admitting: Internal Medicine

## 2017-09-14 DIAGNOSIS — R2231 Localized swelling, mass and lump, right upper limb: Secondary | ICD-10-CM

## 2017-09-20 ENCOUNTER — Ambulatory Visit
Admission: RE | Admit: 2017-09-20 | Discharge: 2017-09-20 | Disposition: A | Discharge status: Home / Self Care | Payer: 59 | Source: Ambulatory Visit | Attending: Internal Medicine | Admitting: Internal Medicine

## 2017-09-20 DIAGNOSIS — R2231 Localized swelling, mass and lump, right upper limb: Secondary | ICD-10-CM

## 2017-10-17 ENCOUNTER — Encounter: Payer: Self-pay | Admitting: Neurology

## 2017-10-18 ENCOUNTER — Ambulatory Visit: Payer: 59 | Admitting: Neurology

## 2017-10-18 ENCOUNTER — Encounter: Payer: Self-pay | Admitting: Neurology

## 2017-10-18 VITALS — BP 109/73 | HR 75 | Ht 61.5 in | Wt 203.0 lb

## 2017-10-18 DIAGNOSIS — F5104 Psychophysiologic insomnia: Secondary | ICD-10-CM | POA: Diagnosis not present

## 2017-10-18 DIAGNOSIS — F419 Anxiety disorder, unspecified: Secondary | ICD-10-CM | POA: Diagnosis not present

## 2017-10-18 DIAGNOSIS — R0683 Snoring: Secondary | ICD-10-CM | POA: Insufficient documentation

## 2017-10-18 NOTE — Progress Notes (Signed)
SLEEP MEDICINE CLINIC   Provider:  Melvyn Novasarmen  Trusten Hume, M.D.   Primary Care Physician:  Merri BrunetteWalter Pharr, MD   Referring Provider: Fatima SangerPrevost, Mary Ellen, FNP    Chief Complaint  Patient presents with  . New Patient (Initial Visit)    pt alone, rm 11, pt states that she has been having a hard time with getting enough sleep. pt states that she is unable to get to sleep until 11:30pm/12 am, wakes up 3 am and can take 30-60 min before able to fall asleep and wakes up 6 am in the morning. she has been told she snores and grinds teeth. she has had moments where she wakes up trying to catch her breath, gasping for air.     HPI:  Breanna [name] is a 42 y.o. female patient, seen on 10-18-2017, [facility].   Chief complaint according to patient : Breanna [name] is a new patient to our practice, she has [facility] Dr. Renne CriglerPharr.  She was referred for the sleep consultation based on her report of having trouble to initiate sleep, staying asleep but also had reported that she snores. Tried different home remedies, sleepy time tea, avoiding caffeine, having a hot bath or shower before bedtime.  None of this seems to have helped her to go to sleep earlier or to sleep longer. Never had sleep aids prescribed.   Sleep habits are as follows: family dinner is around 8 PM, watching TV and playing on her smart phone -and keeping the TV on when in the bedroom- she has fear of the dark, and she feels unsettled in a totally quiet environment. There is anxiety.  She sleeps on a king bed, flat mattress- and is facing the TV. She leaves the door open , which faces the back door to the kitchen.  She sleeps in all position, 1-2 pillows. She likes to fall asleep to the movies she enjoys and has seen many time before. She likes familiar voices around her. She always wakes at 3 AM , within 15 minutes- TV turns off and she panics, has fear of the  dark -and has great difficulties to resume sleep. Averaging a sleep time of less than 5 hours, rising at 6 AM , getting the son ready for school. 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 Naps can make her feel more tired.   Sleep medical history and family sleep history: she was involuntarily committed to 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 She was there 4-5 month, and gave birth 2 month after being released.  Her sleep changed then and there.   Social history: she works in Clinical biochemistcustomer service. Has 3 children, 24 ( out of the home ) , 18 ( school )  and 41 years old. Works form 10 AM - 6.30 PM. Non smoker, but second hand exposure  ETOH - seldomly, caffeine - no soda, no coffee, rare iced tea, never at home.   Review of Systems: Out of a complete 14 system review, the patient complains of only the following symptoms, and all other reviewed systems are negative.  Snoring, anxiety, claustrophobia, fear of the dark, insomnia-  Want to go to sleep but can't go there.    Epworth score  12/ 24  , Fatigue  severity score 59/ 63   , depression score    Social History   Socioeconomic History  . Marital status: Married    Spouse name: Not on file  . Number of children: Not on file  . Years of education: Not on file  . Highest education level: Not on file  Occupational History  . Not on file  Social Needs  . Financial resource strain: Not on file  . Food insecurity:    Worry: Not on file    Inability: Not on file  . Transportation needs:    Medical: Not on file    Non-medical: Not on file  Tobacco Use  . Smoking status: Never Smoker  . Smokeless tobacco: Never Used  Substance and Sexual Activity  . Alcohol use: Yes    Comment: occasionally  . Drug use: No  . Sexual activity: Not on file  Lifestyle  . Physical activity:    Days per week: Not on file    Minutes per  session: Not on file  . Stress: Not on file  Relationships  . Social connections:    Talks on phone: Not on file    Gets together: Not on file    Attends religious service: Not on file    Active member of club or organization: Not on file    Attends meetings of clubs or organizations: Not on file    Relationship status: Not on file  . Intimate partner violence:    Fear of current or ex partner: Not on file    Emotionally abused: Not on file    Physically abused: Not on file    Forced sexual activity: Not on file  Other Topics Concern  . Not on file  Social History Narrative  . Not on file    Family History  Problem Relation Age of Onset  . Hypertension Mother   . Hypertension Father   . Heart failure Father   . Diabetes Father   . Breast cancer Paternal Aunt   . Diabetes Maternal Grandmother     Past Medical History:  Diagnosis Date  . Anemia    no current med.  . Anxiety   . 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 Bursitis of right shoulder 07/2014  . Depression   . History of cardiac murmur as a child    no cardiologist  . Insomnia     Past Surgical History:  Procedure Laterality Date  . CESAREAN SECTION     x 3  . DIAGNOSTIC LAPAROSCOPY    . ECTOPIC PREGNANCY SURGERY    . IUD REMOVAL N/A 07/25/2012   Procedure: INTRAUTERINE DEVICE (IUD) REMOVAL;  Surgeon: Levi Aland, MD;  Location: [facility];  Service: Gynecology;  Laterality: N/A;  . LAPAROSCOPIC TUBAL LIGATION Left 07/25/2012   Procedure: LAPAROSCOPIC TUBAL LIGATION;  Surgeon: Levi Aland, MD;  Location: [facility];  Service: Gynecology;  Laterality: Left;  with filshie clip  . ORIF ANKLE FRACTURE Right 09/08/2003  . SHOULDER ARTHROSCOPY WITH SUBACROMIAL DECOMPRESSION Right 07/29/2014   Procedure: RIGHT SHOULDER ARTHROSCOPY WITH DEBRIDEMENT  and Acromioplasty;  Surgeon: Frederico Hamman, MD;  Location: [facility];  Service: Orthopedics;  Laterality: Right;  ANESTHESIA:   GENERAL, PRE/POST OP SCALENE  . WISDOM TOOTH EXTRACTION      No current outpatient medications on file.   No current facility-administered medications for this visit.    Reviewed labs- has elevated monocytes, low vit D, low MCV- iron deficiency?  Allergies as of 10/18/2017 - Review Complete 10/18/2017  Allergen Reaction Noted  . Aloe Itching and Rash 07/24/2014    Vitals: BP 109/73   Pulse 75   Ht 5' 1.5" (1.562 m)   Wt 203 lb (92.1 kg)   BMI 37.74 kg/m  Last Weight:  Wt Readings from Last 1 Encounters:  10/18/17 203 lb (92.1 kg)   ZOX:WRUE mass index is 37.74 kg/m.     Last Height:   Ht Readings from Last 1 Encounters:  10/18/17 5' 1.5" (1.562 m)    Physical exam:  General: The patient is awake, alert and appears not in acute distress. The patient is well groomed. Head: Normocephalic, atraumatic. Neck is supple. Mallampati 3  neck circumference: 14.5 . Nasal airflow patent . Retrognathia is seen.  Cardiovascular:  Regular rate and rhythm , without  murmurs or carotid bruit, and without distended neck veins. Respiratory: Lungs are clear to auscultation. Skin:  Without evidence of edema, or rash Trunk: BMI is 37.7 .  Neurologic exam : The patient is awake and alert, oriented to place and time.   Memory subjective  described as impaired. Attention span & concentration ability appears normal- but she feels fatigue impairs multitasking.   Speech is fluent,  without dysarthria, dysphonia or aphasia.  Mood and affect are appropriate.  Cranial nerves: Pupils are equal and briskly reactive to light. colored contacts. Extraocular movements  in vertical and horizontal planes intact and without nystagmus. Visual fields by finger perimetry are intact. Hearing to finger rub intact. Facial sensation intact to fine touch. Facial motor strength is symmetric and tongue and uvula move midline. Shoulder shrug was symmetrical.  Motor exam:  Normal tone, muscle bulk and symmetric  strength in all extremities. Sensory:  Fine touch, pinprick and vibration were tested in all extremities. She often feels her arm and fingers go to sleep.   Coordination: Rapid alternating movements in the fingers/hands was normal.  Finger-to-nose maneuver normal without evidence of ataxia, dysmetria or tremor. Gait and station: Patient walks without assistive device. Turns with 3 Steps. Romberg testing is negative. Deep tendon reflexes: in the  upper and lower extremities are symmetric and intact.    Assessment:  After physical and neurologic examination, review of laboratory studies,  Personal review of imaging studies, reports of other /same  Imaging studies, results of polysomnography and / or neurophysiology testing and pre-existing records as far as provided in visit., my assessment is   1) Breanna [name] reports difficulties with insomnia that have been present for almost 20 years.  She has now reached a point of almost decompensation, it becomes hard to juggle her job performance, her household and childcare being continuously sleep deprived.  We discussed today the sources of insomnia and I think for Breanna [name] these are related to anxiety and some rather traumatic events during her teenage life.  She feels not safe in a room that is dark or quiet.  She chooses a TV to have familiar noises and voices around her that helped her to blind out other sounds in the night.  Her bedroom is never quite dark and she feels anxious in a very dark room.  The bedroom door stays open so that she can see the back door.  She reports vivid dreams but not nightmarish in character -  PTSD (?).  I would like for her to eliminate the screen light the electronics from the bedroom but this will be a hard step for her to take.  #  1 I would like to replace the TV screen and sounds as a soothing audiobook of her choice something that actually turns her mind to warts the story that is told and allows her to drift  off to sleep without a constant screen night.  Some patients find ocean waves or jungle sounds Rosalita Chessman, what ever she would like in that category is longer does not vocals be allowed to.  I like for her to go to bed reading in the book was pages rather than on the screen.  This also usually activates our senses and a positive distraction from stimuli that promote fear.  If she wants to try an over-the-counter sleep aid I would just use melatonin but less than 5 mg at night 1, 2 to 5 mg are considered safe even in children.  2) the middle of the night arousal seems to be related to the TV being switched off by a timer.  If we can replace a soothing audio stimulus for the TV she may have a chance to sleep through the night better.  I would like for her to advance her bedtime to well before midnight.  3) As to the snoring, I am happy to check her for obstructive sleep apnea she does have an elevated body mass index and she has retrognathia. OSA risk factors.   The patient was advised of the nature of the diagnosed disorder , the treatment options and the  risks for general health and wellness arising from not treating the condition.   I spent more than 45 minutes of face to face time with the patient.  Greater than 50% of time was spent in counseling and coordination of care. We have discussed the diagnosis and differential and I answered the patient's questions.    Plan:  Treatment plan and additional workup :  HST to screen for OSA No PSG at this time, will work on Insomnia first.  Anxiety treatments would be strongly recommended.    Please remember to try to maintain good sleep hygiene, which means: Keep a regular sleep and wake schedule, try not to exercise or have a meal within 2 hours of your bedtime, try to keep your bedroom conducive for sleep, that is, cool and dark, without light distractors such as an illuminated alarm clock, and refrain from watching TV right before sleep or in the middle  of the night and do not keep the TV or radio on during the night. Also, try not to use or play on electronic devices at bedtime, such as your cell phone, tablet PC or laptop. If you like to read at bedtime on an electronic device, try to dim the background light as much as possible. Do not eat in the middle of the night.   We will request a sleep study.    We will look for snoring or sleep apnea.   For chronic insomnia, you are best followed by a psychiatrist and/or sleep psychologist.   We will call you with the sleep study results and make a follow up appointment if needed.      Melvyn Novas, MD 10/18/2017, 9:05 AM  Certified in Neurology by ABPN Certified in Sleep Medicine by [facility] [address] [place], [place] [address]

## 2017-10-18 NOTE — Patient Instructions (Signed)
Please remember to try to maintain good sleep hygiene, which means: Keep a regular sleep and wake schedule, try not to exercise or have a meal within 2 hours of your bedtime, try to keep your bedroom conducive for sleep, that is, cool and dark, without light distractors such as an illuminated alarm clock, and refrain from watching TV right before sleep or in the middle of the night and do not keep the TV or radio on during the night. Also, try not to use or play on electronic devices at bedtime, such as your cell phone, tablet PC or laptop. If you like to read at bedtime on an electronic device, try to dim the background light as much as possible. Do not eat in the middle of the night.   We will request a sleep study.    We will look for leg twitching and snoring or sleep apnea.   For chronic insomnia, you are best followed by a psychiatrist and/or sleep psychologist.   We will call you with the sleep study results and make a follow up appointment if needed.   Insomnia Insomnia is a sleep disorder that makes it difficult to fall asleep or to stay asleep. Insomnia can cause tiredness (fatigue), low energy, difficulty concentrating, mood swings, and poor performance at work or school. There are three different ways to classify insomnia:  Difficulty falling asleep.  Difficulty staying asleep.  Waking up too early in the morning.  Any type of insomnia can be long-term (chronic) or short-term (acute). Both are common. Short-term insomnia usually lasts for three months or less. Chronic insomnia occurs at least three times a week for longer than three months. What are the causes? Insomnia may be caused by another condition, situation, or substance, such as:  Anxiety.  Certain medicines.  Gastroesophageal reflux disease (GERD) or other gastrointestinal conditions.  Asthma or other breathing conditions.  Restless legs syndrome, sleep apnea, or other sleep disorders.  Chronic  pain.  Menopause. This may include hot flashes.  Stroke.  Abuse of alcohol, tobacco, or illegal drugs.  Depression.  Caffeine.  Neurological disorders, such as Alzheimer disease.  An overactive thyroid (hyperthyroidism).  The cause of insomnia may not be known. What increases the risk? Risk factors for insomnia include:  Gender. Women are more commonly affected than men.  Age. Insomnia is more common as you get older.  Stress. This may involve your professional or personal life.  Income. Insomnia is more common in people with lower income.  Lack of exercise.  Irregular work schedule or night shifts.  Traveling between different time zones.  What are the signs or symptoms? If you have insomnia, trouble falling asleep or trouble staying asleep is the main symptom. This may lead to other symptoms, such as:  Feeling fatigued.  Feeling nervous about going to sleep.  Not feeling rested in the morning.  Having trouble concentrating.  Feeling irritable, anxious, or depressed.  How is this treated? Treatment for insomnia depends on the cause. If your insomnia is caused by an underlying condition, treatment will focus on addressing the condition. Treatment may also include:  Medicines to help you sleep.  Counseling or therapy.  Lifestyle adjustments.  Follow these instructions at home:  Take medicines only as directed by your health care provider.  Keep regular sleeping and waking hours. Avoid naps.  Keep a sleep diary to help you and your health care provider figure out what could be causing your insomnia. Include: ? When you sleep. ?   When you wake up during the night. ? How well you sleep. ? How rested you feel the next day. ? Any side effects of medicines you are taking. ? What you eat and drink.  Make your bedroom a comfortable place where it is easy to fall asleep: ? Put up shades or special blackout curtains to block light from outside. ? Use a  white noise machine to block noise. ? Keep the temperature cool.  Exercise regularly as directed by your health care provider. Avoid exercising right before bedtime.  Use relaxation techniques to manage stress. Ask your health care provider to suggest some techniques that may work well for you. These may include: ? Breathing exercises. ? Routines to release muscle tension. ? Visualizing peaceful scenes.  Cut back on alcohol, caffeinated beverages, and cigarettes, especially close to bedtime. These can disrupt your sleep.  Do not overeat or eat spicy foods right before bedtime. This can lead to digestive discomfort that can make it hard for you to sleep.  Limit screen use before bedtime. This includes: ? Watching TV. ? Using your smartphone, tablet, and computer.  Stick to a routine. This can help you fall asleep faster. Try to do a quiet activity, brush your teeth, and go to bed at the same time each night.  Get out of bed if you are still awake after 15 minutes of trying to sleep. Keep the lights down, but try reading or doing a quiet activity. When you feel sleepy, go back to bed.  Make sure that you drive carefully. Avoid driving if you feel very sleepy.  Keep all follow-up appointments as directed by your health care provider. This is important. Contact a health care provider if:  You are tired throughout the day or have trouble in your daily routine due to sleepiness.  You continue to have sleep problems or your sleep problems get worse. Get help right away if:  You have serious thoughts about hurting yourself or someone else. This information is not intended to replace advice given to you by your health care provider. Make sure you discuss any questions you have with your health care provider. Document Released: 01/21/2000 Document Revised: 06/25/2015 Document Reviewed: 10/24/2013 Elsevier Interactive Patient Education  2018 Elsevier Inc.  

## 2017-11-07 ENCOUNTER — Ambulatory Visit: Payer: 59 | Admitting: Neurology

## 2017-11-07 DIAGNOSIS — R0683 Snoring: Secondary | ICD-10-CM

## 2017-11-07 DIAGNOSIS — F5104 Psychophysiologic insomnia: Secondary | ICD-10-CM

## 2017-11-07 DIAGNOSIS — F419 Anxiety disorder, unspecified: Secondary | ICD-10-CM

## 2017-11-07 DIAGNOSIS — G4710 Hypersomnia, unspecified: Secondary | ICD-10-CM

## 2017-11-14 NOTE — Procedures (Signed)
NAME:  Breanna [name]                                                                DOB: 19-Jul-1975 MEDICAL RECORD NUMBER  [id number]                                                 DOS:  11/07/2017 REFERRING PHYSICIAN: Fatima Sanger, FNP STUDY PERFORMED: Home Sleep Study HISTORY: Breanna [name] is a 42 y.o. female patient, seen on 10-18-2017 in a referral for a sleep evaluation.   Breanna [name] is a new patient to our practice, she has been followed by [facility] Dr. Renne Crigler.  She was referred for the sleep consultation based on her report of having trouble to initiate sleep, staying asleep, but also had reported that she snores. Tried different home remedies, sleepy time tea, avoiding caffeine, having a hot bath or shower before bedtime.  None of this seems to have helped her to go to sleep earlier or to sleep longer. Never had sleep aids prescribed. Sleep habits:  watching TV and playing on her smart phone -and keeping the TV on when in the bedroom- she has fear of the dark, and she feels unsettled in a totally quiet environment. There is anxiety. She sleeps on a king bed, on a flat mattress- and is facing the TV screen. She leaves the bedroom door open, which faces the backdoor to the kitchen.  She likes familiar voices around her. When the TV turns off she panics, has fear of the dark -and has great difficulties to resume sleep. Naps can make her feel more tired.    Epworth Sleepiness score: 12/ 24 points, Fatigue severity score at 59/ 63, BMI: 38.3  STUDY RESULTS:  Total Recording Time: 8 hours 3 minutes; 5 h 33 min. Total Apnea/Hypopnea Index (AHI):  3.2 /h; RDI:  5.7 /h, REM AHI 6.4/h.  Average Oxygen Saturation: 96 %; Lowest Oxygen Saturation:  93%. Average Heart Rate:   80 bpm (between 61 and 127 bpm) IMPRESSION:  Clinically insignificant degree of apnea.  RECOMMENDATION: I would appreciate input from the patient about this night on HST- did she sleep, was she up a lot?  This sleep study did not indicate sleep apnea to be present, only snoring, and there was no hypoxemia. Recommend treatment for anxiety, sleep hygiene improvement.  I certify that I have reviewed the raw data recording prior to the issuance of this report in accordance with the standards of the American Academy of Sleep Medicine (AASM). Melvyn Novas, M.D.    11-14-2017    Medical Director of [facility], accredited by the AASM. Diplomat of the ABPN and ABSM.

## 2017-11-15 ENCOUNTER — Telehealth: Payer: Self-pay | Admitting: Neurology

## 2017-11-15 NOTE — Telephone Encounter (Signed)
That is great

## 2017-11-15 NOTE — Telephone Encounter (Signed)
-----   Message from Melvyn Novas, MD sent at 11/14/2017  5:51 PM EDT ----- IMPRESSION: Clinically insignificant degree of apnea.  RECOMMENDATION: I would appreciate input from the patient about  this night on HST- did she sleep, was she up a lot? This sleep  study did not indicate sleep apnea to be present, only snoring,  and there was no hypoxemia.   Recommend treatment for anxiety, sleep hygiene improvement. PCP, please consider counseling/ behavior therapy.   Melvyn Novas, MD

## 2017-11-15 NOTE — Telephone Encounter (Signed)
Called the patient to discuss her sleep study results. Informed the patient based off of this HST she didn't have any degree of sleep apnea. Her oxygen level and heart rate maintained in normal range during the test. Questioned with the patient how her night went while wearing the HST. Pt states that she had frequent episodes of waking up but that this was a night where she was actually able to sleep compared to other nights. Informed the patient Dr Vickey Huger did note snoring was present on the study. Informed the patient that there is no need to follow up in the sleep clinic since we were unable to find a sleep diagnosis. Educated the patient on good sleep hygiene and advised the patient to discuss with her PCP about treatment for anxiety as well as considering a cognitive behavior referral for treatment of insomnia. Pt verbalized understanding. Pt had no questions at this time but was encouraged to call back if questions arise.

## 2018-11-01 ENCOUNTER — Other Ambulatory Visit: Payer: Self-pay

## 2018-11-01 ENCOUNTER — Encounter: Payer: Self-pay | Admitting: Cardiology

## 2018-11-01 ENCOUNTER — Ambulatory Visit (INDEPENDENT_AMBULATORY_CARE_PROVIDER_SITE_OTHER): Payer: No Typology Code available for payment source | Admitting: Cardiology

## 2018-11-01 VITALS — BP 106/61 | HR 78 | Temp 97.7°F | Ht 61.0 in | Wt 209.0 lb

## 2018-11-01 DIAGNOSIS — R0609 Other forms of dyspnea: Secondary | ICD-10-CM

## 2018-11-01 DIAGNOSIS — R0789 Other chest pain: Secondary | ICD-10-CM | POA: Diagnosis not present

## 2018-11-01 DIAGNOSIS — R0600 Dyspnea, unspecified: Secondary | ICD-10-CM

## 2018-11-01 DIAGNOSIS — R002 Palpitations: Secondary | ICD-10-CM | POA: Diagnosis not present

## 2018-11-01 DIAGNOSIS — R0602 Shortness of breath: Secondary | ICD-10-CM

## 2018-11-01 NOTE — Progress Notes (Signed)
Patient referred by Holland Commons, FNP for Chest pain, palpitations, shortness of breath  Subjective:   Melisssa [name], female    DOB: October 29, 1975, 43 y.o.   MRN: [id number]   Chief Complaint  Patient presents with  . Chest Pain  . Shortness of Breath  . New Patient (Initial Visit)    HPI  43 y.o. African American female with depression, anxiety, referred for palpitations.  [place].  She experienced chest pain and palpitations while climbing up the hill, along with shortness of breath.  The symptoms continue for 2 weeks thereafter.  At baseline, patient exercises regularly with 30 minutes of elliptical and ab exercises.  She does not experience the symptoms during her regular workouts.  She underwent echocardiogram that showed no abnormalities.  Patient is a Librarian, academic at [employer].  She states that her job is stressful.  Also, she has stressors at home with certain family situations.  She denies any physical abuse or safety concerns.   Past Medical History:  Diagnosis Date  . Anemia    no current med.  . Anxiety   . 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 Bursitis of right shoulder 07/2014  . Depression   . History of cardiac murmur as a child    no cardiologist  . Insomnia      Past Surgical History:  Procedure Laterality Date  . CESAREAN SECTION     x 3  . DIAGNOSTIC LAPAROSCOPY    . ECTOPIC PREGNANCY SURGERY    . IUD REMOVAL N/A 07/25/2012   Procedure: INTRAUTERINE DEVICE (IUD) REMOVAL;  Surgeon: Olga Millers, MD;  Location: [facility];  Service: Gynecology;  Laterality: N/A;  . LAPAROSCOPIC TUBAL LIGATION Left 07/25/2012   Procedure: LAPAROSCOPIC TUBAL LIGATION;  Surgeon: Olga Millers, MD;  Location: [facility];  Service: Gynecology;  Laterality: Left;  with filshie clip  . ORIF ANKLE FRACTURE Right 09/08/2003  . SHOULDER ARTHROSCOPY WITH SUBACROMIAL DECOMPRESSION Right 07/29/2014    Procedure: RIGHT SHOULDER ARTHROSCOPY WITH DEBRIDEMENT  and Acromioplasty;  Surgeon: Earlie Server, MD;  Location: [facility];  Service: Orthopedics;  Laterality: Right;  ANESTHESIA:  GENERAL, PRE/POST OP SCALENE  . WISDOM TOOTH EXTRACTION       Social History   Socioeconomic History  . Marital status: Married    Spouse name: Not on file  . Number of children: Not on file  . Years of education: Not on file  . Highest education level: Not on file  Occupational History  . Not on file  Social Needs  . Financial resource strain: Not on file  . Food insecurity    Worry: Not on file    Inability: Not on file  . Transportation needs    Medical: Not on file    Non-medical: Not on file  Tobacco Use  . Smoking status: Never Smoker  . Smokeless tobacco: Never Used  Substance and Sexual Activity  . Alcohol use: Yes    Comment: occasionally  . Drug use: No  . Sexual activity: Not on file  Lifestyle  . Physical activity    Days per week: Not on file    Minutes per session: Not on file  . Stress: Not on file  Relationships  . Social Herbalist on phone: Not on file    Gets together: Not on file    Attends religious service: Not on file  Active member of club or organization: Not on file    Attends meetings of clubs or organizations: Not on file    Relationship status: Not on file  . Intimate partner violence    Fear of current or ex partner: Not on file    Emotionally abused: Not on file    Physically abused: Not on file    Forced sexual activity: Not on file  Other Topics Concern  . Not on file  Social History Narrative  . Not on file     Family History  Problem Relation Age of Onset  . Hypertension Mother   . Hypertension Father   . Heart failure Father   . Diabetes Father   . Breast cancer Paternal Aunt   . Diabetes Maternal Grandmother      No current outpatient medications on file prior to visit.   No current  facility-administered medications on file prior to visit.     Cardiovascular studies:   EKG 11/01/2018: Sinus rhythm 73 bpm. Normal EKG.  Echocardiogram 10/03/2018: LV size normal. EF 55-60%. Trace TR. 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   Recent labs: 05/03/2018: Glucose 87. BUN/Cr 12/0.8. eGFR normal. Na/K 141/4.1. Rest of the CMP normal.  H/H 12.8/39.3. MCV 78.9. Platelets 229.     Review of Systems  Constitution: Negative for decreased appetite, malaise/fatigue, weight gain and weight loss.  HENT: Negative for congestion.   Eyes: Negative for visual disturbance.  Cardiovascular: Positive for chest pain, dyspnea on exertion and palpitations. Negative for leg swelling and syncope.  Respiratory: Negative for cough.   Endocrine: Negative for cold intolerance.  Hematologic/Lymphatic: Does not bruise/bleed easily.  Skin: Negative for itching and rash.  Musculoskeletal: Negative for myalgias.  Gastrointestinal: Negative for abdominal pain, nausea and vomiting.  Genitourinary: Negative for dysuria.  Neurological: Negative for dizziness and weakness.  Psychiatric/Behavioral: The patient is not nervous/anxious.   All other systems reviewed and are negative.        Vitals:   11/01/18 0845  BP: 106/61  Pulse: 78  Temp: 97.7 F (36.5 C)  SpO2: 98%     Body mass index is 39.49 kg/m. Filed Weights   11/01/18 0845  Weight: 209 lb (94.8 kg)     Objective:   Physical Exam  Constitutional: She is oriented to person, place, and time. She appears well-developed and well-nourished. No distress.  HENT:  Head: Normocephalic and atraumatic.  Eyes: Pupils are equal, round, and reactive to light. Conjunctivae are normal.  Neck: No JVD present.  Cardiovascular: Normal rate, regular rhythm and intact distal pulses.  No murmur heard. Pulmonary/Chest: Effort normal and breath sounds normal. She has no wheezes. She has no rales.  Abdominal: Soft. Bowel sounds are normal. There is no  rebound.  Musculoskeletal:        General: No edema.  Lymphadenopathy:    She has no cervical adenopathy.  Neurological: She is alert and oriented to person, place, and time. No cranial nerve deficit.  Skin: Skin is warm and dry.  Psychiatric: She has a normal mood and affect.  Nursing note and vitals reviewed.         Assessment & Recommendations:   43 y.o. African American female with depression, anxiety, referred for palpitations.  Chest pain, shortness of breath, palpitations: Normal physical exam, EKG, echocardiogram.  Symptoms are most likely related to deconditioning.  Will perform treadmill stress test and event monitor to rule out any significant abnormalities.   I will see her on as needed basis.  Thank you for referring the patient to [place]. Please feel free to contact with any questions.  Nigel Mormon, MD [facility] Pager: [phone] Office: [phone] If no answer Cell [phone]

## 2018-11-27 ENCOUNTER — Ambulatory Visit: Payer: No Typology Code available for payment source

## 2018-11-27 ENCOUNTER — Ambulatory Visit (INDEPENDENT_AMBULATORY_CARE_PROVIDER_SITE_OTHER): Payer: No Typology Code available for payment source

## 2018-11-27 ENCOUNTER — Other Ambulatory Visit: Payer: Self-pay

## 2018-11-27 DIAGNOSIS — R002 Palpitations: Secondary | ICD-10-CM

## 2018-11-27 DIAGNOSIS — R0609 Other forms of dyspnea: Secondary | ICD-10-CM

## 2018-11-27 DIAGNOSIS — R0789 Other chest pain: Secondary | ICD-10-CM

## 2018-11-27 DIAGNOSIS — R0600 Dyspnea, unspecified: Secondary | ICD-10-CM

## 2018-11-28 ENCOUNTER — Telehealth: Payer: Self-pay

## 2018-11-28 NOTE — Progress Notes (Signed)
Called pt no answer, left a vm

## 2018-11-28 NOTE — Telephone Encounter (Signed)
Called pt and pt mention she will try to wear the other sensitive leads and if they break her out too then she will return the monitor.

## 2018-11-28 NOTE — Telephone Encounter (Signed)
Please advise from the message above thank you

## 2018-11-29 NOTE — Progress Notes (Signed)
Called pt to inform her about her stress test

## 2018-12-20 NOTE — Progress Notes (Signed)
Lvm for pt to call back. 

## 2018-12-20 NOTE — Progress Notes (Signed)
No heart rhythm abnormality seen on monitor.

## 2020-05-07 ENCOUNTER — Ambulatory Visit: Payer: Self-pay | Admitting: Surgery

## 2020-05-07 NOTE — H&P (Signed)
[facility] Appointment: 05/07/2020 10:10 AM Location: [facility] Patient #: [id number] DOB: 11/04/1975 Married / Language: English / Race: Black or African American Female  History of Present Illness Breanna Fus A. Lovene Maret MD; 05/07/2020 12:24 PM) Patient words: Patient presents for evaluation of right axillary lipoma. Present for over a year and getting larger larger. It causes pain and discomfort. There is no redness or drainage. Location right axilla.  The patient is a 45 year old female.   Allergies Marliss Coots, CNA; 05/07/2020 10:19 AM) No Known Drug Allergies [05/07/2020]: Allergies Reconciled  Medication History Marliss Coots, CNA; 05/07/2020 10:19 AM) clonazePAM (0.5MG  Tablet, Oral) Active. Amoxicillin-Pot Clavulanate (500-125MG  Tablet, Oral) Active. Methocarbamol (500MG  Tablet, Oral) Active. Medications Reconciled    Vitals (Donyelle Alston CNA; 05/07/2020 10:20 AM) 05/07/2020 10:19 AM Weight: 211.13 lb Height: 62in Body Surface Area: 1.96 m Body Mass Index: 38.61 kg/m  Temp.: 41F  Pulse: 97 (Regular)  P.OX: 98% (Room air) BP: 130/90(Sitting, Left Arm, Standard)        Physical Exam (Niranjan Rufener A. Airrion Otting MD; 05/07/2020 12:25 PM)  General Mental Status-Alert. General Appearance-Consistent with stated age. Hydration-Well hydrated. Voice-Normal.  Head and Neck Head-normocephalic, atraumatic with no lesions or palpable masses. Trachea-midline. Thyroid Gland Characteristics - normal size and consistency.  Chest and Lung Exam Chest and lung exam reveals -quiet, even and easy respiratory effort with no use of accessory muscles and on auscultation, normal breath sounds, no adventitious sounds and normal vocal resonance. Inspection Chest Wall - Normal. Back - normal.  Cardiovascular Cardiovascular examination reveals -normal heart sounds, regular rate and rhythm with no murmurs and normal pedal pulses  bilaterally.  Neurologic Neurologic evaluation reveals -alert and oriented x 3 with no impairment of recent or remote memory. Mental Status-Normal.  Lymphatic Axillary  General Axillary Region: Bilateral - Description - Normal. Tenderness - Non Tender. Note: 5 cm x 6 cm fatty mobile mass right external consistent with lipoma    Assessment & Plan (Rhylei Mcquaig A. 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 Risks include bleeding, infection, seroma formation, numbness, damage to underlying structures, cardiovascular risk factors, deep vein thrombosis, the need for surgery or recurrence. She agrees to proceed  Current Plans Pt Education - [facility] Free Text Education/Instructions: discussed with patient and provided information. Pt Education - [facility] General Post-op HCI The pathophysiology of skin & subcutaneous masses was discussed. Natural history risks without surgery were discussed. I recommended surgery to remove the mass. I explained the technique of removal with use of local anesthesia & possible need for more aggressive sedation/anesthesia for patient comfort.  Risks such as bleeding, infection, wound breakdown, heart attack, death, and other risks were discussed. I noted a good likelihood this will help address the problem. Possibility that this will not correct all symptoms was explained. Possibility of regrowth/recurrence of the mass was discussed. We will work to minimize complications. Questions were answered. The patient expresses understanding & wishes to proceed with surgery.

## 2020-06-10 ENCOUNTER — Other Ambulatory Visit: Payer: Self-pay | Admitting: Surgery

## 2020-07-02 ENCOUNTER — Other Ambulatory Visit: Payer: Self-pay | Admitting: Obstetrics and Gynecology

## 2020-07-02 DIAGNOSIS — R928 Other abnormal and inconclusive findings on diagnostic imaging of breast: Secondary | ICD-10-CM

## 2020-07-26 ENCOUNTER — Ambulatory Visit: Payer: 59

## 2020-07-26 ENCOUNTER — Other Ambulatory Visit: Payer: Self-pay

## 2020-07-26 ENCOUNTER — Ambulatory Visit
Admission: RE | Admit: 2020-07-26 | Discharge: 2020-07-26 | Disposition: A | Discharge status: Home / Self Care | Payer: No Typology Code available for payment source | Source: Ambulatory Visit | Attending: Obstetrics and Gynecology | Admitting: Obstetrics and Gynecology

## 2020-07-26 DIAGNOSIS — R928 Other abnormal and inconclusive findings on diagnostic imaging of breast: Secondary | ICD-10-CM

## 2020-09-23 ENCOUNTER — Ambulatory Visit (INDEPENDENT_AMBULATORY_CARE_PROVIDER_SITE_OTHER): Payer: No Typology Code available for payment source | Admitting: Plastic Surgery

## 2020-09-23 ENCOUNTER — Other Ambulatory Visit: Payer: Self-pay

## 2020-09-23 ENCOUNTER — Encounter: Payer: Self-pay | Admitting: Plastic Surgery

## 2020-09-23 VITALS — BP 105/70 | HR 79 | Ht 61.5 in | Wt 207.8 lb

## 2020-09-23 DIAGNOSIS — M546 Pain in thoracic spine: Secondary | ICD-10-CM

## 2020-09-23 DIAGNOSIS — N62 Hypertrophy of breast: Secondary | ICD-10-CM

## 2020-09-23 DIAGNOSIS — M545 Low back pain, unspecified: Secondary | ICD-10-CM | POA: Diagnosis not present

## 2020-09-23 DIAGNOSIS — M4004 Postural kyphosis, thoracic region: Secondary | ICD-10-CM | POA: Diagnosis not present

## 2020-09-23 NOTE — Progress Notes (Signed)
Referring Provider Fatima Sanger, FNP [address] [place],  [place] [address]   CC:  Chief Complaint  Patient presents with   Advice Only      Sarayah ALYSSANDRA HULSEBUS is an 45 y.o. female.  HPI: Patient presents to discuss breast reduction.  She said years of back pain, neck pain and shoulder grooving related to her large breasts.  She tried over-the-counter medications, warm packs, cold packs and supportive bras with little relief.  She also gets rashes beneath her breast that been refractory to over-the-counter treatments.  She is currently a triple D and wants to be a C or D cup.  She does have a family history of breast cancer with her paternal aunt.  She had a right axillary lipoma removed back in May which was benign.  She does not smoke and is not a diabetic.  She has been to the chiropractor before with little relief of her symptoms.  She is up-to-date with her mammograms most recent of which was back in May.  This was normal.  Allergies  Allergen Reactions   Aloe Itching and Rash    No outpatient encounter medications on file as of 09/23/2020.   No facility-administered encounter medications on file as of 09/23/2020.     Past Medical History:  Diagnosis Date   Anemia    no current med.   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   Bursitis of right shoulder 07/2014   Depression    History of cardiac murmur as a child    no cardiologist   Insomnia     Past Surgical History:  Procedure Laterality Date   CESAREAN SECTION     x 3   DIAGNOSTIC LAPAROSCOPY     ECTOPIC PREGNANCY SURGERY     IUD REMOVAL N/A 07/25/2012   Procedure: INTRAUTERINE DEVICE (IUD) REMOVAL;  Surgeon: Levi Aland, MD;  Location: [facility];  Service: Gynecology;  Laterality: N/A;   LAPAROSCOPIC TUBAL LIGATION Left 07/25/2012   Procedure: LAPAROSCOPIC TUBAL LIGATION;  Surgeon: Levi Aland, MD;  Location: [facility];  Service: Gynecology;  Laterality:  Left;  with filshie clip   ORIF ANKLE FRACTURE Right 09/08/2003   SHOULDER ARTHROSCOPY WITH SUBACROMIAL DECOMPRESSION Right 07/29/2014   Procedure: RIGHT SHOULDER ARTHROSCOPY WITH DEBRIDEMENT  and Acromioplasty;  Surgeon: Frederico Hamman, MD;  Location: [facility];  Service: Orthopedics;  Laterality: Right;  ANESTHESIA:  GENERAL, PRE/POST OP SCALENE   WISDOM TOOTH EXTRACTION      Family History  Problem Relation Age of Onset   Hypertension Mother    Hypertension Father    Heart failure Father    Diabetes Father    Breast cancer Paternal Aunt    Diabetes Maternal Grandmother     Social History   Social History Narrative   Not on file     Review of Systems General: Denies fevers, chills, weight loss CV: Denies chest pain, shortness of breath, palpitations  Physical Exam Vitals with BMI 09/23/2020 11/01/2018 10/18/2017  Height 5' 1.5" 5\' 1"  5' 1.5"  Weight 207 lbs 13 oz 209 lbs 203 lbs  BMI 38.63 39.51 37.74  Systolic 105 106  Diastolic 70 61 73  Pulse 79 78 75    General:  No acute distress,  Alert and oriented, Non-Toxic, Normal speech and affect Breast: She has grade 3 ptosis.  Sternal notch to nipple is 34 cm bilaterally.  Nipple to fold is 16  cm bilaterally.  No obvious masses.  She has a right axillary scar from the lipoma that is healed fine.  Assessment/Plan The patient has bilateral symptomatic macromastia.  She is a good candidate for a breast reduction.  She is interested in pursuing surgical treatment.  She has tried supportive garments and fitted bras with no relief.  The details of breast reduction surgery were discussed.  I explained the procedure in detail along the with the expected scars.  The risks were discussed in detail and include bleeding, infection, damage to surrounding structures, need for additional procedures, nipple loss, change in nipple sensation, persistent pain, contour irregularities and asymmetries.  I explained that breast feeding  is often not possible after breast reduction surgery.  We discussed the expected postoperative course with an overall recovery period of about 1 month.  She demonstrated full understanding of all risks.  We discussed her personal risk factors that include BMI.  The patient is interested in pursuing surgical treatment.  I anticipate approximately 650g of tissue removed from each side.   Allena Napoleon 09/23/2020, 2:50 PM

## 2021-09-26 IMAGING — MG MM DIGITAL DIAGNOSTIC UNILAT*R* W/ TOMO W/ CAD
6 series · 6 of 18 positions shown · non-contrast
Comparison: Previous exam(s).

CLINICAL DATA: Screening recall for a right breast asymmetry.

EXAM:
DIGITAL DIAGNOSTIC UNILATERAL RIGHT MAMMOGRAM WITH TOMOSYNTHESIS AND
CAD
TECHNIQUE: Right digital diagnostic mammography and breast tomosynthesis was
performed. The images were evaluated with computer-aided detection.

[R MLO synth-2D (1 of 2)]
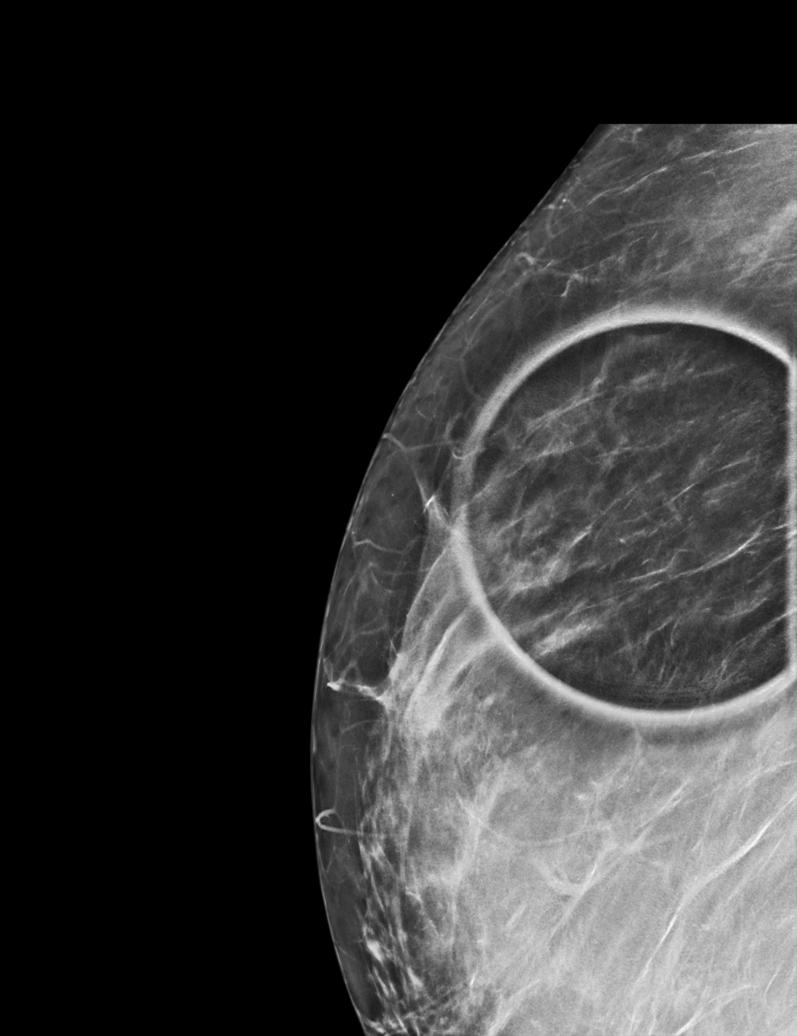

[R MLO synth-2D (2 of 2)]
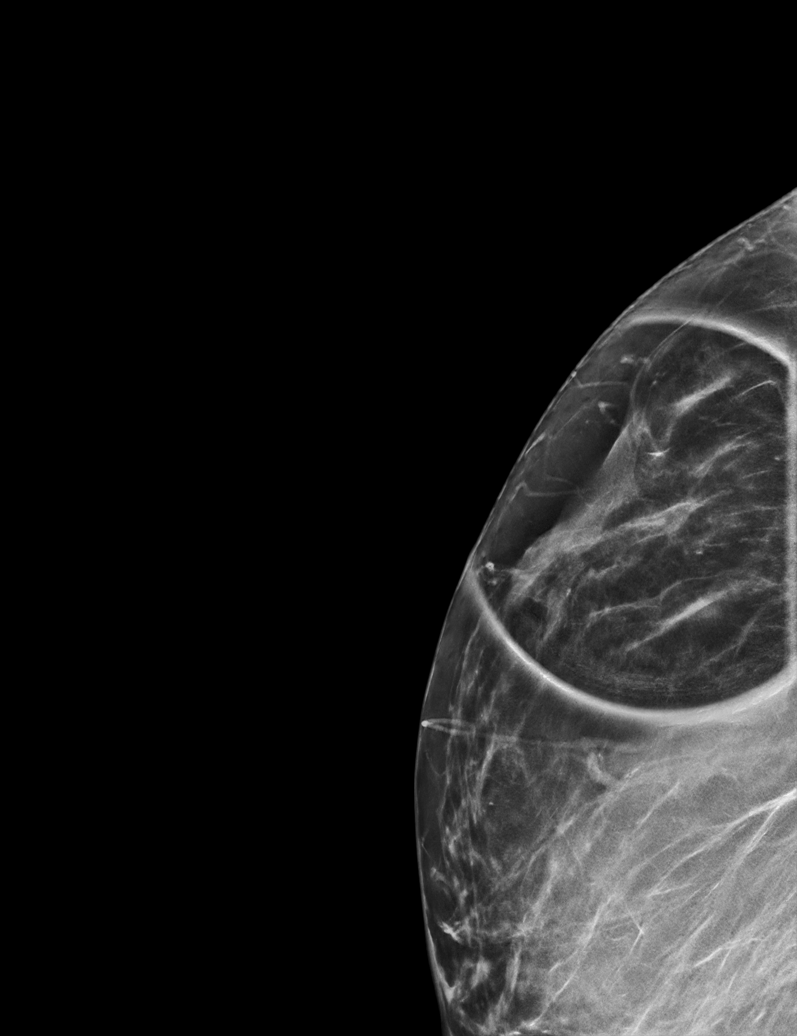

[R ML synth-2D]
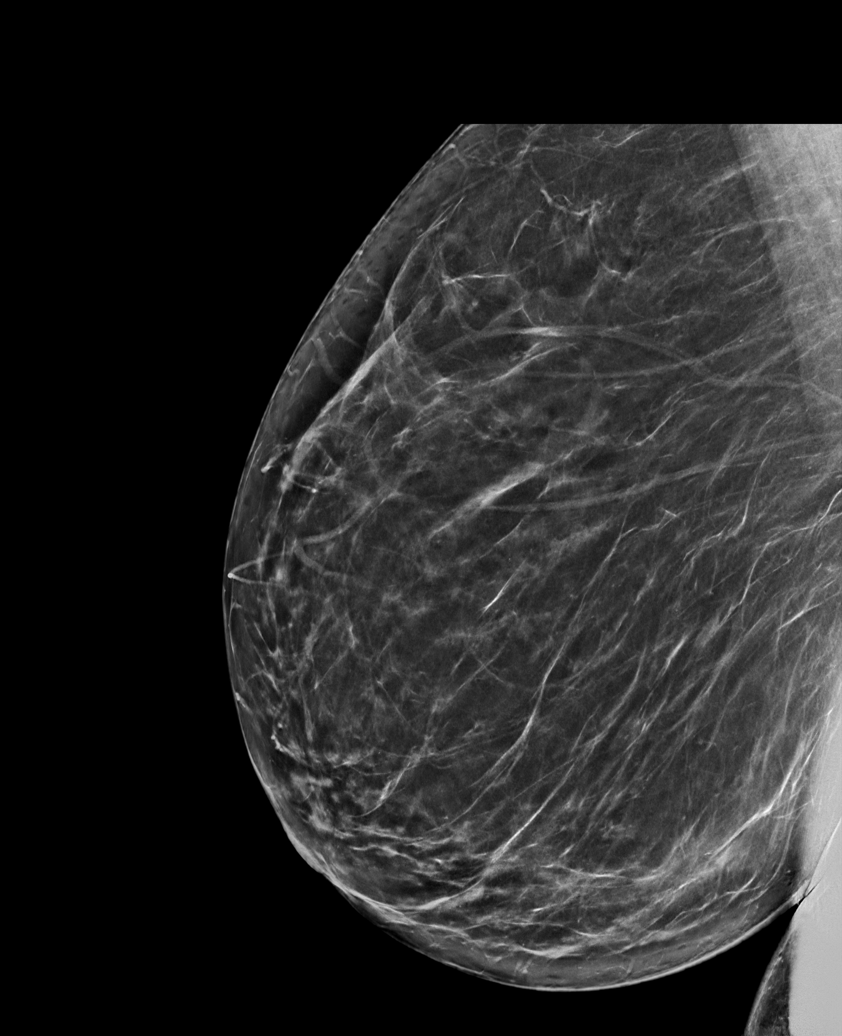

[R MLO tomo (1 of 2) · tomo slice 34/67.0]
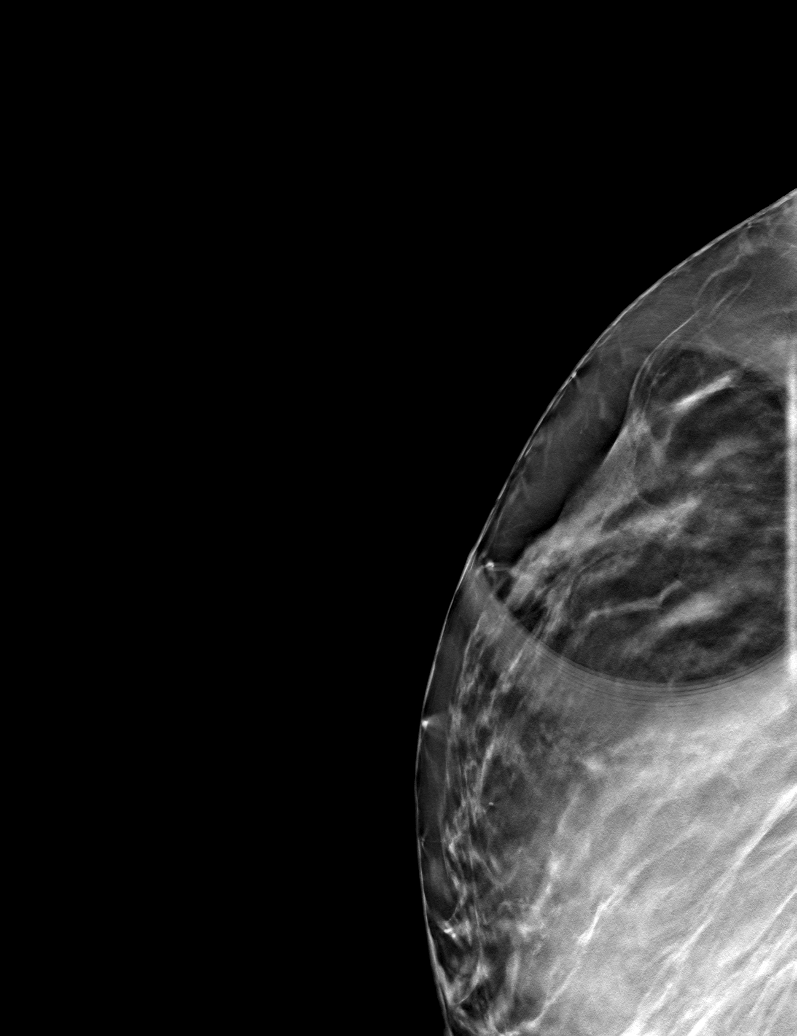

[R ML tomo · tomo slice 41/81.0]
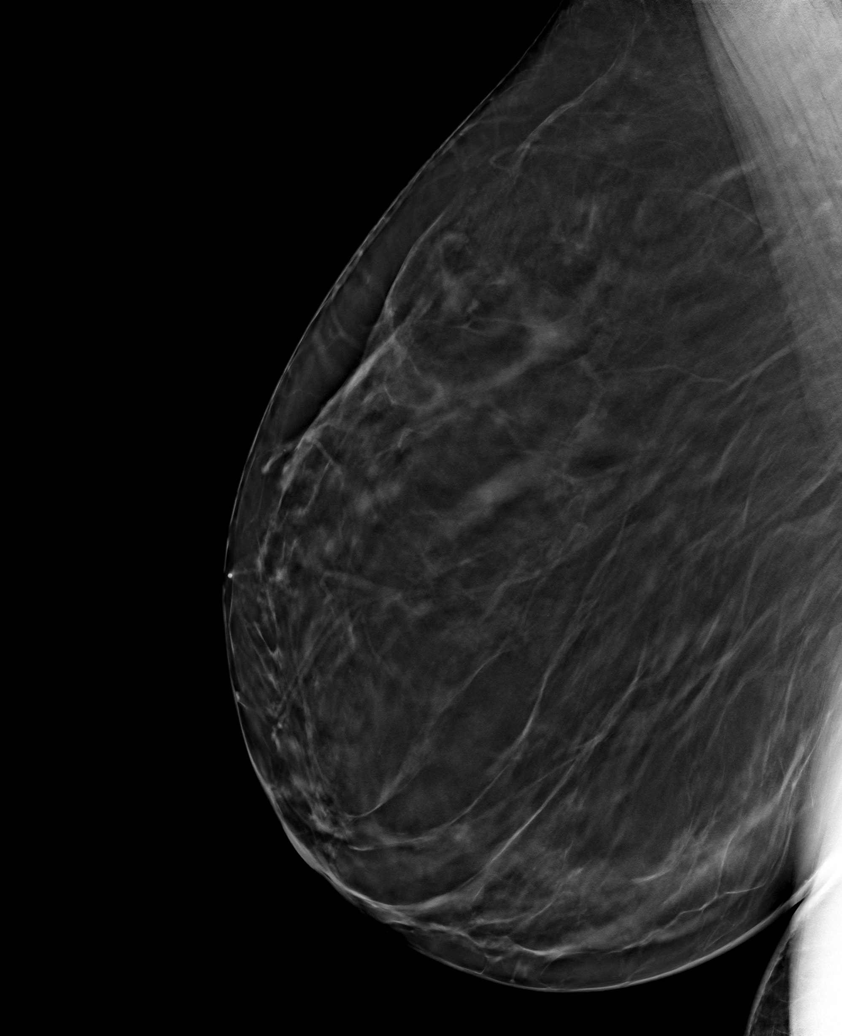

[R MLO tomo (2 of 2) · tomo slice 35/70.0]
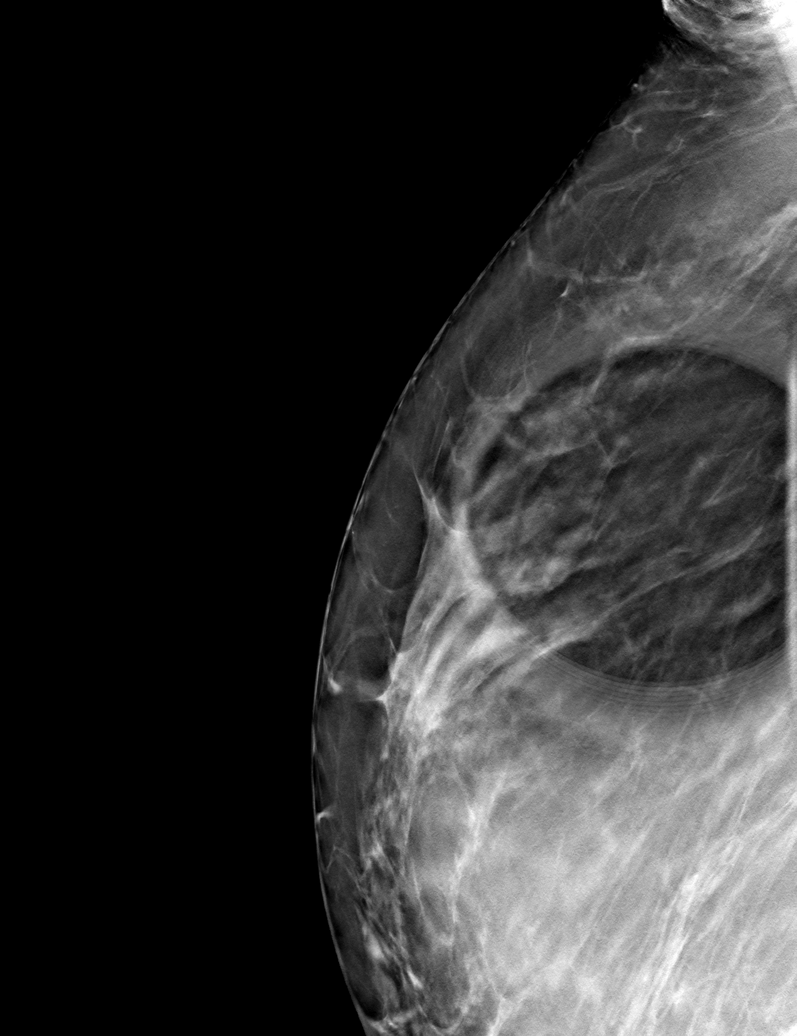

[6 of 18 positions shown; findings below may reference images not displayed]

ACR Breast Density Category c: The breast tissue is heterogeneously
dense, which may obscure small masses.
FINDINGS: The asymmetry in the superior right breast resolves with spot
compression tomosynthesis imaging. No suspicious calcifications,
masses or areas of distortion are seen in the right breast.
IMPRESSION: Resolution of the right breast asymmetry consistent with overlapping
fibroglandular tissue.

RECOMMENDATION:
Screening mammogram in one year.(Code:57-6-DKB)

I have discussed the findings and recommendations with the patient.
If applicable, a reminder letter will be sent to the patient
regarding the next appointment.

BI-RADS CATEGORY  1: Negative.

## 2021-10-21 ENCOUNTER — Ambulatory Visit (INDEPENDENT_AMBULATORY_CARE_PROVIDER_SITE_OTHER): Payer: No Typology Code available for payment source | Admitting: Podiatry

## 2021-10-21 ENCOUNTER — Ambulatory Visit (INDEPENDENT_AMBULATORY_CARE_PROVIDER_SITE_OTHER): Payer: No Typology Code available for payment source

## 2021-10-21 DIAGNOSIS — M722 Plantar fascial fibromatosis: Secondary | ICD-10-CM | POA: Diagnosis not present

## 2021-10-21 NOTE — Progress Notes (Signed)
Subjective:  Patient ID: Breanna [name], female    DOB: 05/25/1975,  MRN: [id number]  Chief Complaint  Patient presents with   Foot Pain    Bilateral foot pain     46 y.o. female presents with the above complaint.  Patient presents with bilateral heel pain has been progressively getting worse hurts with ambulation hurts with pressure.  She states that she is terrified of needles she does not want to do a needle.  She would like to discuss bracing options.  She denies any other acute complaints pain scale 7 out of 10 sharp shooting in nature.   Review of Systems: Negative except as noted in the HPI. Denies N/V/F/Ch.  Past Medical History:  Diagnosis Date   Anemia    no current med.   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   Bursitis of right shoulder 07/2014   Depression    History of cardiac murmur as a child    no cardiologist   Insomnia     Current Outpatient Medications:    meloxicam (MOBIC) 15 MG tablet, Take 1 tablet every day by oral route for 30 days., Disp: , Rfl:    nalbuphine (NUBAIN) 10 MG/ML injection, Take 10 mg as needed by injection route for 1 day., Disp: , Rfl:    promethazine (PHENERGAN) 25 MG/ML injection, Take 25 mg as needed by injection route for 1 day., Disp: , Rfl:    amoxicillin-clavulanate (AUGMENTIN) 875-125 MG tablet, TAKE 1 TABLET(S) EVERY 12 HOURS BY ORAL ROUTE FOR 10 DAYS., Disp: , Rfl:    azithromycin (ZITHROMAX) 250 MG tablet, 2 TABLETS ON THE FIRST DAY, THEN 1 TABLET DAILY FOR 4 DAYS ONCE A DAY ORALLY 5 DAY(S), Disp: , Rfl:    benzonatate (TESSALON) 100 MG capsule, 1 CAPSULE AS NEEDED THREE TIMES A DAY ORALLY, Disp: , Rfl:    chlorpheniramine-HYDROcodone (TUSSIONEX) 10-8 MG/5ML, TK 5 MLS PO BID  Q TWELVE H, Disp: , Rfl:    clindamycin (CLEOCIN) 300 MG capsule, TAKE 1 CAPSULE 3 TIMES A DAY FOR 5 DAYS, Disp: , Rfl:    metroNIDAZOLE (FLAGYL) 500 MG tablet, TAKE 1 TABLET TWICE A DAY FOR 7 DAYS, Disp: , Rfl:     ofloxacin (OCUFLOX) 0.3 % ophthalmic solution, INSTILL 1 DROP INTO AFFECTED EYE(S) BY OPHTHALMIC ROUTE 4 TIMES PER DAY, Disp: , Rfl:    oxyCODONE-acetaminophen (PERCOCET/ROXICET) 5-325 MG tablet, Take 1-2 tablets by mouth every 4 (four) hours as needed., Disp: , Rfl:    predniSONE (DELTASONE) 20 MG tablet, TAKE 3 TABLET(S) EVERY DAY BY ORAL ROUTE FOR 6 DAYS., Disp: , Rfl:    sertraline (ZOLOFT) 50 MG tablet, TAKE 1/2 TABLET DAILY FOR 7 DAYS THEN TAKE 1 TABLET BY MOUTH EVERY DAY, Disp: , Rfl:    sucralfate (CARAFATE) 1 GM/10ML suspension, TAKE 10 ML 4 TIMES A DAY BY ORAL ROUTE FOR 7 DAYS., Disp: , Rfl:   Social History   Tobacco Use  Smoking Status Never  Smokeless Tobacco Never    Allergies  Allergen Reactions   Aloe Itching and Rash   Objective:  There were no vitals filed for this visit. There is no height or weight on file to calculate BMI. Constitutional Well developed. Well nourished.  Vascular Dorsalis pedis pulses palpable bilaterally. Posterior tibial pulses palpable bilaterally. Capillary refill normal to all digits.  No cyanosis or clubbing noted. Pedal hair growth normal.  Neurologic Normal speech. Oriented to person, place, and time. Epicritic sensation  to light touch grossly present bilaterally.  Dermatologic Nails well groomed and normal in appearance. No open wounds. No skin lesions.  Orthopedic: Normal joint ROM without pain or crepitus bilaterally. No visible deformities. Tender to palpation at the calcaneal tuber bilaterally. No pain with calcaneal squeeze bilaterally. Ankle ROM diminished range of motion bilaterally. Silfverskiold Test: positive bilaterally.   Radiographs: Taken and reviewed. No acute fractures or dislocations. No evidence of stress fracture.  Plantar heel spur present. Posterior heel spur absent.  Pes planovalgus deformity noted  Assessment:   1. Plantar fasciitis of right foot   2. Plantar fasciitis of left foot    Plan:  Patient  was evaluated and treated and all questions answered.  Plantar Fasciitis, bilaterally - XR reviewed as above.  - Educated on icing and stretching. Instructions given.  -She would like to hold off on injections for now.  We will plan on doing them next time if there is no improvement - DME: Plantar fascial brace dispensed to support the medial longitudinal arch of the foot and offload pressure from the heel and prevent arch collapse during weightbearing - Pharmacologic management: None    No follow-ups on file.

## 2021-10-21 NOTE — Patient Instructions (Signed)

## 2021-11-18 ENCOUNTER — Ambulatory Visit: Payer: No Typology Code available for payment source | Admitting: Podiatry

## 2022-04-20 ENCOUNTER — Other Ambulatory Visit (HOSPITAL_BASED_OUTPATIENT_CLINIC_OR_DEPARTMENT_OTHER): Payer: Self-pay | Admitting: Registered Nurse

## 2022-04-20 DIAGNOSIS — E7800 Pure hypercholesterolemia, unspecified: Secondary | ICD-10-CM

## 2022-08-24 ENCOUNTER — Encounter (HOSPITAL_BASED_OUTPATIENT_CLINIC_OR_DEPARTMENT_OTHER): Payer: Self-pay

## 2022-08-24 ENCOUNTER — Ambulatory Visit (HOSPITAL_BASED_OUTPATIENT_CLINIC_OR_DEPARTMENT_OTHER)
Admission: RE | Admit: 2022-08-24 | Discharge: 2022-08-24 | Disposition: A | Discharge status: Home / Self Care | Payer: No Typology Code available for payment source | Source: Ambulatory Visit | Attending: Registered Nurse | Admitting: Registered Nurse

## 2022-08-24 DIAGNOSIS — E7800 Pure hypercholesterolemia, unspecified: Secondary | ICD-10-CM | POA: Insufficient documentation

## 2022-09-11 ENCOUNTER — Other Ambulatory Visit: Payer: Self-pay | Admitting: Registered Nurse

## 2022-09-11 DIAGNOSIS — E7800 Pure hypercholesterolemia, unspecified: Secondary | ICD-10-CM

## 2023-02-28 ENCOUNTER — Ambulatory Visit: Payer: No Typology Code available for payment source | Admitting: Gastroenterology

## 2023-05-08 ENCOUNTER — Ambulatory Visit (INDEPENDENT_AMBULATORY_CARE_PROVIDER_SITE_OTHER): Payer: No Typology Code available for payment source | Admitting: Gastroenterology

## 2023-05-08 ENCOUNTER — Encounter: Payer: Self-pay | Admitting: Gastroenterology

## 2023-05-08 DIAGNOSIS — R1012 Left upper quadrant pain: Secondary | ICD-10-CM

## 2023-05-08 DIAGNOSIS — R1013 Epigastric pain: Secondary | ICD-10-CM

## 2023-05-08 DIAGNOSIS — R12 Heartburn: Secondary | ICD-10-CM

## 2023-05-08 DIAGNOSIS — K219 Gastro-esophageal reflux disease without esophagitis: Secondary | ICD-10-CM | POA: Diagnosis not present

## 2023-05-08 DIAGNOSIS — Z1211 Encounter for screening for malignant neoplasm of colon: Secondary | ICD-10-CM

## 2023-05-08 MED ORDER — NA SULFATE-K SULFATE-MG SULF 17.5-3.13-1.6 GM/177ML PO SOLN: 1.0000 | ORAL | 0 refills | Status: DC

## 2023-05-08 NOTE — Progress Notes (Signed)
 Chief Complaint: GERD, colon cancer screening   Referring Provider:     Fatima Sanger, FNP   HPI:     Breanna [name] is a 48 y.o. female with medical history as outlined below, referred to the Gastroenterology Clinic for evaluation of GERD and to discuss colon cancer screening.  Developed reflux sx over the last year. Index sxs of heartburn and belching. Rare regurgitation. Currently takes Prilosec 40 mg OTC daily. No dysphagia.    Separately, will have occasional left flank/LUQ pain which can radiate to epigastrium. Worse with chocolate, soda.  No nausea/vomiting.  Otherwise, no recent lower GI sxs. No previous CRC screening.   No known family history of CRC, GI malignancy, liver disease, pancreatic disease, or IBD.   No recent labs or abdominal imaging for review. No previous EGD or colonoscopy.    Past Medical History:  Diagnosis Date   Anemia    no current med.   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   Bursitis of right shoulder 07/2014   Depression    GERD (gastroesophageal reflux disease)    History of cardiac murmur as a child    no cardiologist   HTN (hypertension)    Insomnia      Past Surgical History:  Procedure Laterality Date   CESAREAN SECTION     x 3   DIAGNOSTIC LAPAROSCOPY     ECTOPIC PREGNANCY SURGERY     IUD REMOVAL N/A 07/25/2012   Procedure: INTRAUTERINE DEVICE (IUD) REMOVAL;  Surgeon: Levi Aland, MD;  Location: [facility];  Service: Gynecology;  Laterality: N/A;   LAPAROSCOPIC TUBAL LIGATION Left 07/25/2012   Procedure: LAPAROSCOPIC TUBAL LIGATION;  Surgeon: Levi Aland, MD;  Location: [facility];  Service: Gynecology;  Laterality: Left;  with filshie clip   ORIF ANKLE FRACTURE Right 09/08/2003   SHOULDER ARTHROSCOPY WITH SUBACROMIAL DECOMPRESSION Right 07/29/2014   Procedure: RIGHT SHOULDER ARTHROSCOPY WITH DEBRIDEMENT  and Acromioplasty;  Surgeon: Frederico Hamman, MD;  Location: [facility];  Service: Orthopedics;  Laterality: Right;  ANESTHESIA:  GENERAL, PRE/POST OP SCALENE   WISDOM TOOTH EXTRACTION     Family History  Problem Relation Age of Onset   Hypertension Mother    Hypertension Father    Heart failure Father    Diabetes Father    Diabetes Maternal Grandmother    Breast cancer Paternal Aunt    Lactose intolerance Son    ADD / ADHD Son    Lactose intolerance Son    ADD / ADHD Son    Lactose intolerance Son    Plantar fasciitis Son    Social History   Tobacco Use   Smoking status: Former    Types: Cigarettes   Smokeless tobacco: Never  Vaping Use   Vaping status: Never Used  Substance Use Topics   Alcohol use: Yes    Comment: occasionally   Drug use: No   Current Outpatient Medications  Medication Sig Dispense Refill   amoxicillin-clavulanate (AUGMENTIN) 875-125 MG tablet TAKE 1 TABLET(S) EVERY 12 HOURS BY ORAL ROUTE FOR 10 DAYS.     azithromycin (ZITHROMAX) 250 MG tablet 2 TABLETS ON THE FIRST DAY, THEN 1 TABLET DAILY FOR 4 DAYS ONCE A DAY ORALLY 5 DAY(S)     benzonatate (TESSALON) 100 MG capsule 1 CAPSULE AS NEEDED THREE TIMES A DAY ORALLY     chlorpheniramine-HYDROcodone (TUSSIONEX) 10-8 MG/5ML TK  5 MLS PO BID  Q TWELVE H     clindamycin (CLEOCIN) 300 MG capsule TAKE 1 CAPSULE 3 TIMES A DAY FOR 5 DAYS     meloxicam (MOBIC) 15 MG tablet Take 1 tablet every day by oral route for 30 days.     metroNIDAZOLE (FLAGYL) 500 MG tablet TAKE 1 TABLET TWICE A DAY FOR 7 DAYS     nalbuphine (NUBAIN) 10 MG/ML injection Take 10 mg as needed by injection route for 1 day.     ofloxacin (OCUFLOX) 0.3 % ophthalmic solution INSTILL 1 DROP INTO AFFECTED EYE(S) BY OPHTHALMIC ROUTE 4 TIMES PER DAY     oxyCODONE-acetaminophen (PERCOCET/ROXICET) 5-325 MG tablet Take 1-2 tablets by mouth every 4 (four) hours as needed.     predniSONE (DELTASONE) 20 MG tablet TAKE 3 TABLET(S) EVERY DAY BY ORAL ROUTE FOR 6 DAYS.     promethazine (PHENERGAN) 25 MG/ML  injection Take 25 mg as needed by injection route for 1 day.     sertraline (ZOLOFT) 50 MG tablet TAKE 1/2 TABLET DAILY FOR 7 DAYS THEN TAKE 1 TABLET BY MOUTH EVERY DAY     sucralfate (CARAFATE) 1 GM/10ML suspension TAKE 10 ML 4 TIMES A DAY BY ORAL ROUTE FOR 7 DAYS.     No current facility-administered medications for this visit.   Allergies  Allergen Reactions   Aloe Itching and Rash     Review of Systems: All systems reviewed and negative except where noted in HPI.     Physical Exam:    Wt Readings from Last 3 Encounters:  05/08/23 194 lb 6 oz (88.2 kg)  09/23/20 207 lb 12.8 oz (94.3 kg)  11/01/18 209 lb (94.8 kg)    BP 104/60 (BP Location: Left Arm, Patient Position: Sitting, Cuff Size: Large)   Pulse 80   Ht 5\' 1"  (1.549 m) Comment: height measured without shoes  Wt 194 lb 6 oz (88.2 kg)   BMI 36.73 kg/m  Constitutional:  Pleasant, in no acute distress. Psychiatric: Normal mood and affect. Behavior is normal. Cardiovascular: Normal rate, regular rhythm. No edema Pulmonary/chest: Effort normal and breath sounds normal. No wheezing, rales or rhonchi. Abdominal: Mild MEG and LUQ pain without rebound, guarding, or peritoneal signs. Soft, nondistended. Bowel sounds active throughout. There are no masses palpable. No hepatomegaly. Neurological: Alert and oriented to person place and time. Skin: Skin is warm and dry. No rashes noted.   ASSESSMENT AND PLAN;   1) Epigastric pain 2) LUQ pain - EGD to evaluate for mucosal/luminal pathology  3) GERD - EGD to evaluate for hiatal hernia, LES laxity, erosive esophagitis - Continue omeprazole for the time being with medication modification depending on endoscopic findings - Continue antireflux lifestyle/dietary modifications  4) Colon cancer screening Due for age-appropriate, average risk CRC screening - Schedule colonoscopy  The indications, risks, and benefits of EGD and colonoscopy were explained to the patient in  detail. Risks include but are not limited to bleeding, perforation, adverse reaction to medications, and cardiopulmonary compromise. Sequelae include but are not limited to the possibility of surgery, hospitalization, and mortality. The patient verbalized understanding and wished to proceed. All questions answered, referred to scheduler and bowel prep ordered. Further recommendations pending results of the exam.     Shellia Cleverly, DO, FACG  05/08/2023, 9:29 AM   Larose Hires, Gaylyn Lambert, FNP

## 2023-05-08 NOTE — Patient Instructions (Addendum)
 _______________________________________________________  If your blood pressure at your visit was 140/90 or greater, please contact your primary care physician to follow up on this.  If you are age 47 or younger, your body mass index should be between 19-25. Your Body mass index is 36.73 kg/m. If this is out of the aformentioned range listed, please consider follow up with your Primary Care Provider.  ________________________________________________________  The [facility] providers would like to encourage you to use [facility] to communicate with providers for non-urgent requests or questions.  Due to long hold times on the telephone, sending your provider a message by [facility] may be a faster and more efficient way to get a response.  Please allow 48 business hours for a response.  Please remember that this is for non-urgent requests.  _______________________________________________________  Bonita Quin have been scheduled for an endoscopy and colonoscopy. Please follow the written instructions given to you at your visit today.  If you use inhalers (even only as needed), please bring them with you on the day of your procedure.  DO NOT TAKE 7 DAYS PRIOR TO TEST- Trulicity (dulaglutide) Ozempic, Wegovy (semaglutide) Mounjaro (tirzepatide) Bydureon Bcise (exanatide extended release)  DO NOT TAKE 1 DAY PRIOR TO YOUR TEST Rybelsus (semaglutide) Adlyxin (lixisenatide) Victoza (liraglutide) Byetta (exanatide) ___________________________________________________________________________  Due to recent changes in healthcare laws, you may see the results of your imaging and laboratory studies on MyChart before your provider has had a chance to review them.  We understand that in some cases there may be results that are confusing or concerning to you. Not all laboratory results come back in the same time frame and the provider may be waiting for multiple results in order to interpret others.  Please give [place] 48  hours in order for your provider to thoroughly review all the results before contacting the office for clarification of your results.   It was a pleasure to see you today!  Vito Cirigliano, D.O.

## 2023-07-03 ENCOUNTER — Telehealth: Payer: Self-pay | Admitting: Gastroenterology

## 2023-07-03 NOTE — Telephone Encounter (Signed)
 Called and spoke with patient. Patient wanted to clarify instructions since she was seen over a month ago. I reviewed some instructions with patient, she is aware that she can take all necessary medication at least 3 hours prior to her arrival time tomorrow. Patient verbalized understanding and had no other concerns at the end of the call.

## 2023-07-03 NOTE — Telephone Encounter (Signed)
 PT is scheduled for a colonoscopy on tomorrow and wants to know if she can still take her BP medications before the procedure. Please advise.

## 2023-07-04 ENCOUNTER — Ambulatory Visit: Admitting: Gastroenterology

## 2023-07-04 ENCOUNTER — Encounter: Payer: Self-pay | Admitting: Gastroenterology

## 2023-07-04 VITALS — BP 132/78 | HR 80 | Temp 97.5°F | Resp 13 | Ht 61.0 in | Wt 194.0 lb

## 2023-07-04 DIAGNOSIS — K635 Polyp of colon: Secondary | ICD-10-CM | POA: Diagnosis not present

## 2023-07-04 DIAGNOSIS — Z1211 Encounter for screening for malignant neoplasm of colon: Secondary | ICD-10-CM

## 2023-07-04 DIAGNOSIS — R1013 Epigastric pain: Secondary | ICD-10-CM

## 2023-07-04 DIAGNOSIS — D122 Benign neoplasm of ascending colon: Secondary | ICD-10-CM

## 2023-07-04 DIAGNOSIS — R142 Eructation: Secondary | ICD-10-CM

## 2023-07-04 DIAGNOSIS — K5730 Diverticulosis of large intestine without perforation or abscess without bleeding: Secondary | ICD-10-CM

## 2023-07-04 DIAGNOSIS — R12 Heartburn: Secondary | ICD-10-CM

## 2023-07-04 DIAGNOSIS — K219 Gastro-esophageal reflux disease without esophagitis: Secondary | ICD-10-CM | POA: Diagnosis not present

## 2023-07-04 MED ORDER — SODIUM CHLORIDE 0.9 % IV SOLN: 500.0000 mL | Freq: Once | INTRAVENOUS | Status: DC

## 2023-07-04 NOTE — Patient Instructions (Addendum)
 - 1 polyp removed and sent to pathology - biopsies of stomach taken                          - Resume previous diet. - Continue present medications. - Await pathology results. - Repeat colonoscopy for surveillance based on    pathology results. - Return to GI office PRN.  YOU HAD AN ENDOSCOPIC PROCEDURE TODAY AT THE [facility]:   Refer to the procedure report that was given to you for any specific questions about what was found during the examination.  If the procedure report does not answer your questions, please call your gastroenterologist to clarify.  If you requested that your care partner not be given the details of your procedure findings, then the procedure report has been included in a sealed envelope for you to review at your convenience later.  YOU SHOULD EXPECT: Some feelings of bloating in the abdomen. Passage of more gas than usual.  Walking can help get rid of the air that was put into your GI tract during the procedure and reduce the bloating. If you had a lower endoscopy (such as a colonoscopy or flexible sigmoidoscopy) you may notice spotting of blood in your stool or on the toilet paper. If you underwent a bowel prep for your procedure, you may not have a normal bowel movement for a few days.  Please Note:  You might notice some irritation and congestion in your nose or some drainage.  This is from the oxygen used during your procedure.  There is no need for concern and it should clear up in a day or so.  SYMPTOMS TO REPORT IMMEDIATELY:  Following lower endoscopy (colonoscopy or flexible sigmoidoscopy):  Excessive amounts of blood in the stool  Significant tenderness or worsening of abdominal pains  Swelling of the abdomen that is new, acute  Fever of 100F or higher  Following upper endoscopy (EGD)  Vomiting of blood or coffee ground material  New chest pain or pain under the shoulder blades  Painful or persistently difficult swallowing  New shortness of  breath  Fever of 100F or higher  Black, tarry-looking stools  For urgent or emergent issues, a gastroenterologist can be reached at any hour by calling [phone]. Do not use MyChart messaging for urgent concerns.    DIET:  We do recommend a small meal at first, but then you may proceed to your regular diet.  Drink plenty of fluids but you should avoid alcoholic beverages for 24 hours.  ACTIVITY:  You should plan to take it easy for the rest of today and you should NOT DRIVE or use heavy machinery until tomorrow (because of the sedation medicines used during the test).    FOLLOW UP: Our staff will call the number listed on your records the next business day following your procedure.  We will call around 7:15- 8:00 am to check on you and address any questions or concerns that you may have regarding the information given to you following your procedure. If we do not reach you, we will leave a message.     If any biopsies were taken you will be contacted by phone or by letter within the next 1-3 weeks.  Please call us  at [phone] if you have not heard about the biopsies in 3 weeks.    SIGNATURES/CONFIDENTIALITY: You and/or your care partner have signed paperwork which will be entered into your electronic medical record.  These signatures attest to the fact that that the information above on your After Visit Summary has been reviewed and is understood.  Full responsibility of the confidentiality of this discharge information lies with you and/or your care-partner.

## 2023-07-04 NOTE — Progress Notes (Signed)
 Called to room to assist during endoscopic procedure.  Patient ID and intended procedure confirmed with present staff. Received instructions for my participation in the procedure from the performing physician.

## 2023-07-04 NOTE — Progress Notes (Signed)
 GASTROENTEROLOGY PROCEDURE H&P NOTE   Primary Care Physician: Jhon Moselle, FNP    Reason for Procedure:  GERD, LUQ pain, colon cancer screening, belching  Plan:    EGD, colonoscopy  Patient is appropriate for endoscopic procedure(s) in the ambulatory ([facility]) setting.  The nature of the procedure, as well as the risks, benefits, and alternatives were carefully and thoroughly reviewed with the patient. Ample time for discussion and questions allowed. The patient understood, was satisfied, and agreed to proceed.     HPI: Breanna [name] is a 48 y.o. female who presents for EGD for evaluation of GERD, heartburn, belching, regurgitation, along with separate issue intermittent LUQ/epigastric pain.  Also presents for colonoscopy for routine index CRC screening.  Otherwise no changes in clinical history since office visit on 05/08/2023.  Past Medical History:  Diagnosis Date   Anemia    no current med.   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   Bursitis of right shoulder 07/2014   Depression    GERD (gastroesophageal reflux disease)    History of cardiac murmur as a child    no cardiologist   HTN (hypertension)    Insomnia     Past Surgical History:  Procedure Laterality Date   CESAREAN SECTION     x 3   DIAGNOSTIC LAPAROSCOPY     ECTOPIC PREGNANCY SURGERY     IUD REMOVAL N/A 07/25/2012   Procedure: INTRAUTERINE DEVICE (IUD) REMOVAL;  Surgeon: Hamp Levine, MD;  Location: [facility];  Service: Gynecology;  Laterality: N/A;   LAPAROSCOPIC TUBAL LIGATION Left 07/25/2012   Procedure: LAPAROSCOPIC TUBAL LIGATION;  Surgeon: Hamp Levine, MD;  Location: [facility];  Service: Gynecology;  Laterality: Left;  with filshie clip   ORIF ANKLE FRACTURE Right 09/08/2003   SHOULDER ARTHROSCOPY WITH SUBACROMIAL DECOMPRESSION Right 07/29/2014   Procedure: RIGHT SHOULDER ARTHROSCOPY WITH DEBRIDEMENT  and Acromioplasty;  Surgeon: Marlena Sima, MD;   Location: [facility];  Service: Orthopedics;  Laterality: Right;  ANESTHESIA:  GENERAL, PRE/POST OP SCALENE   WISDOM TOOTH EXTRACTION      Prior to Admission medications   Medication Sig Start Date End Date Taking? Authorizing Provider  ferrous sulfate 325 (65 FE) MG EC tablet Take 325 mg by mouth 3 (three) times daily with meals.   Yes [provider]  triamterene-hydrochlorothiazide (MAXZIDE-25) 37.5-25 MG tablet Take 0.5 tablets by mouth every morning.   Yes [provider]    Current Outpatient Medications  Medication Sig Dispense Refill   ferrous sulfate 325 (65 FE) MG EC tablet Take 325 mg by mouth 3 (three) times daily with meals.     triamterene-hydrochlorothiazide (MAXZIDE-25) 37.5-25 MG tablet Take 0.5 tablets by mouth every morning.     Current Facility-Administered Medications  Medication Dose Route Frequency Provider Last Rate Last Admin   0.9 %  sodium chloride  infusion  500 mL Intravenous Once Khamryn Calderone V, DO        Allergies as of 07/04/2023 - Review Complete 07/04/2023  Allergen Reaction Noted   Aloe Itching and Rash 07/24/2014    Family History  Problem Relation Age of Onset   Hypertension Mother    Hypertension Father    Heart failure Father    Diabetes Father    Diabetes Maternal Grandmother    Breast cancer Paternal Aunt    Lactose intolerance Son    ADD / ADHD Son    Lactose intolerance Son  ADD / ADHD Son    Lactose intolerance Son    Plantar fasciitis Son     Social History   Socioeconomic History   Marital status: Married    Spouse name: Not on file   Number of children: 3   Years of education: Not on file   Highest education level: Not on file  Occupational History   Not on file  Tobacco Use   Smoking status: Former    Types: Cigarettes   Smokeless tobacco: Never  Vaping Use   Vaping status: Never Used  Substance and Sexual Activity   Alcohol use: Yes    Comment: occasionally   Drug use: No    Sexual activity: Not on file  Other Topics Concern   Not on file  Social History Narrative   Not on file   Social Drivers of Health   Financial Resource Strain: Not on file  Food Insecurity: Not on file  Transportation Needs: Not on file  Physical Activity: Not on file  Stress: Not on file  Social Connections: Not on file  Intimate Partner Violence: Not on file    Physical Exam: Vital signs in last 24 hours: @BP  116/71 (BP Location: Right Arm, Patient Position: Sitting, Cuff Size: Normal)   Pulse 75   Temp (!) 97.5 F (36.4 C) (Temporal)   Ht 5\' 1"  (1.549 m)   Wt 194 lb (88 kg)   SpO2 100%   BMI 36.66 kg/m  GEN: NAD EYE: Sclerae anicteric ENT: MMM CV: Non-tachycardic Pulm: CTA b/l GI: Soft, NT/ND NEURO:  Alert & Oriented x 3   Harry Lindau, DO  Gastroenterology   07/04/2023 10:45 AM

## 2023-07-04 NOTE — Op Note (Signed)
 [facility] Patient Name: Breanna [name] Procedure Date: 07/04/2023 10:39 AM MRN: [id number] Endoscopist: Harry Lindau , MD, [id number] Age: 48 Referring MD:  Date of Birth: 1975/06/14 Gender: Female Account #: [id number] Procedure:                Colonoscopy Indications:              Screening for colorectal malignant neoplasm, This                            is the patient's first colonoscopy Medicines:                Monitored Anesthesia Care Procedure:                Pre-Anesthesia Assessment:                           - Prior to the procedure, a History and Physical                            was performed, and patient medications and                            allergies were reviewed. The patient's tolerance of                            previous anesthesia was also reviewed. The risks                            and benefits of the procedure and the sedation                            options and risks were discussed with the patient.                            All questions were answered, and informed consent                            was obtained. Prior Anticoagulants: The patient has                            taken no anticoagulant or antiplatelet agents. ASA                            Grade Assessment: II - A patient with mild systemic                            disease. After reviewing the risks and benefits,                            the patient was deemed in satisfactory condition to                            undergo the procedure.  After obtaining informed consent, the colonoscope                            was passed under direct vision. Throughout the                            procedure, the patient's blood pressure, pulse, and                            oxygen saturations were monitored continuously. The                            Olympus Scope [id number] was introduced through the                            anus and  advanced to the the terminal ileum. The                            colonoscopy was performed without difficulty. The                            patient tolerated the procedure well. The quality                            of the bowel preparation was good. The terminal                            ileum, ileocecal valve, appendiceal orifice, and                            rectum were photographed. Scope In: 11:02:38 AM Scope Out: 11:17:23 AM Scope Withdrawal Time: 0 hours 12 minutes 26 seconds  Total Procedure Duration: 0 hours 14 minutes 45 seconds  Findings:                 The perianal and digital rectal examinations were                            normal.                           A 3 mm polyp was found in the ascending colon. The                            polyp was sessile. The polyp was removed with a                            cold snare. Resection and retrieval were complete.                            Estimated blood loss was minimal.                           A few small-mouthed diverticula were found in the  sigmoid colon.                           The exam was otherwise normal throughout the                            remainder of the colon.                           The retroflexed view of the distal rectum and anal                            verge was normal and showed no anal or rectal                            abnormalities.                           The terminal ileum appeared normal. Complications:            No immediate complications. Estimated Blood Loss:     Estimated blood loss was minimal. Impression:               - One 3 mm polyp in the ascending colon, removed                            with a cold snare. Resected and retrieved.                           - Diverticulosis in the sigmoid colon.                           - The distal rectum and anal verge are normal on                            retroflexion view.                            - The examined portion of the ileum was normal. Recommendation:           - Patient has a contact number available for                            emergencies. The signs and symptoms of potential                            delayed complications were discussed with the                            patient. Return to normal activities tomorrow.                            Written discharge instructions were provided to the                            patient.                           -  Resume previous diet.                           - Continue present medications.                           - Await pathology results.                           - Repeat colonoscopy for surveillance based on                            pathology results.                           - Return to GI office PRN. Harry Lindau, MD 07/04/2023 11:25:48 AM

## 2023-07-04 NOTE — Op Note (Signed)
  [facility] Patient Name: Breanna [name] Procedure Date: 07/04/2023 10:39 AM MRN: [id number] Endoscopist: Harry Lindau , MD, [id number] Age: 48 Referring MD:  Date of Birth: 28-Oct-1975 Gender: Female Account #: [id number] Procedure:                Upper GI endoscopy Indications:              Epigastric abdominal pain, Abdominal pain in the                            left upper quadrant, Suspected esophageal reflux,                            Eructation Medicines:                Monitored Anesthesia Care Procedure:                Pre-Anesthesia Assessment:                           - Prior to the procedure, a History and Physical                            was performed, and patient medications and                            allergies were reviewed. The patient's tolerance of                            previous anesthesia was also reviewed. The risks                            and benefits of the procedure and the sedation                            options and risks were discussed with the patient.                            All questions were answered, and informed consent                            was obtained. Prior Anticoagulants: The patient has                            taken no anticoagulant or antiplatelet agents. ASA                            Grade Assessment: II - A patient with mild systemic                            disease. After reviewing the risks and benefits,                            the patient was deemed in satisfactory condition to  undergo the procedure.                           After obtaining informed consent, the endoscope was                            passed under direct vision. Throughout the                            procedure, the patient's blood pressure, pulse, and                            oxygen saturations were monitored continuously. The                            Olympus Scope SN [id number] was  introduced through the                            mouth, and advanced to the second part of duodenum.                            The upper GI endoscopy was accomplished without                            difficulty. The patient tolerated the procedure                            well. Scope In: Scope Out: Findings:                 The examined esophagus was normal.                           The entire examined stomach was normal. Biopsies                            were taken with a cold forceps for histology and                            Helicobacter pylori testing. Estimated blood loss                            was minimal.                           The examined duodenum was normal. Complications:            No immediate complications. Estimated Blood Loss:     Estimated blood loss was minimal. Impression:               - Normal esophagus.                           - Normal stomach. Biopsied.                           - Normal examined duodenum. Recommendation:           -  Patient has a contact number available for                            emergencies. The signs and symptoms of potential                            delayed complications were discussed with the                            patient. Return to normal activities tomorrow.                            Written discharge instructions were provided to the                            patient.                           - Resume previous diet.                           - Continue present medications.                           - Await pathology results. Harry Lindau, MD 07/04/2023 11:22:51 AM

## 2023-07-04 NOTE — Progress Notes (Signed)
 Vitals-DT  Pt's states no medical or surgical changes since previsit or office visit.

## 2023-07-04 NOTE — Progress Notes (Signed)
 Vss nad trans to pacu

## 2023-07-05 ENCOUNTER — Telehealth: Payer: Self-pay

## 2023-07-05 NOTE — Addendum Note (Signed)
 Addended by: Emmalene Hare on: 07/05/2023 07:35 AM   Modules accepted: Orders

## 2023-07-05 NOTE — Telephone Encounter (Signed)
 No answer after follow up call. Unable to leave message due to voicemail being full.

## 2023-07-05 NOTE — Telephone Encounter (Signed)
 Patient stated she is doing okay and not having any complications as of now. States she will call back if anything changes. Please advise.

## 2023-07-09 LAB — SURGICAL PATHOLOGY

## 2023-07-13 ENCOUNTER — Ambulatory Visit: Payer: Self-pay | Admitting: Gastroenterology

## 2023-08-06 NOTE — Telephone Encounter (Signed)
 Inbound call from patient, would like to speak with a nurse in regards to procedure results, she states she has a some questions and would like to discuss. Please advise.

## 2023-08-07 MED ORDER — PANTOPRAZOLE SODIUM 40 MG PO TBEC: 40.0000 mg | DELAYED_RELEASE_TABLET | Freq: Every day | ORAL | 0 refills | Status: DC

## 2023-08-07 MED ORDER — PANTOPRAZOLE SODIUM 40 MG PO TBEC: DELAYED_RELEASE_TABLET | ORAL | 0 refills | Status: DC

## 2023-10-10 ENCOUNTER — Ambulatory Visit: Admitting: Gastroenterology

## 2023-10-11 ENCOUNTER — Other Ambulatory Visit: Payer: Self-pay | Admitting: Gastroenterology

## 2024-01-08 ENCOUNTER — Telehealth: Payer: Self-pay | Admitting: Gastroenterology

## 2024-01-08 MED ORDER — PANTOPRAZOLE SODIUM 40 MG PO TBEC: 40.0000 mg | DELAYED_RELEASE_TABLET | Freq: Every day | ORAL | 1 refills | Status: AC

## 2024-01-08 NOTE — Telephone Encounter (Signed)
 Rx for pantoprazole  sent to [facility] on [address] as requested.

## 2024-01-08 NOTE — Telephone Encounter (Signed)
 Inbound call from patient stating that she is needing a refill on her Pantoprazole  40 MG. Patient is schedule for a OV on January the 6 th. Patient is requesting a least 1 month supply for her pantoprazole  due to her having really bad GERD. Patient would like her refill to go to the [facility] on [address]. Please advise.

## 2024-02-12 ENCOUNTER — Ambulatory Visit (INDEPENDENT_AMBULATORY_CARE_PROVIDER_SITE_OTHER): Admitting: Gastroenterology

## 2024-02-12 ENCOUNTER — Encounter: Payer: Self-pay | Admitting: Gastroenterology

## 2024-02-12 VITALS — BP 104/70 | HR 68 | Ht 61.0 in | Wt 197.1 lb

## 2024-02-12 DIAGNOSIS — K219 Gastro-esophageal reflux disease without esophagitis: Secondary | ICD-10-CM | POA: Diagnosis not present

## 2024-02-12 DIAGNOSIS — R1013 Epigastric pain: Secondary | ICD-10-CM | POA: Diagnosis not present

## 2024-02-12 NOTE — Patient Instructions (Addendum)
 GERD Recommend GERD diet  Continue Protonix  40 mg twice daily   You have been scheduled for an abdominal ultrasound at [facility] (1st floor of hospital) on 02/18/24 at 10:00am . Please arrive 30 minutes prior to your appointment for registration. Make certain not to have anything to eat or drink after midnight prior to your appointment. Should you need to reschedule your appointment, please contact radiology at [phone]. This test typically takes about 30 minutes to perform.  You have been scheduled for an esophageal manometry test at [facility] on 05/21/24 at 10:30am. Please arrive 30 minutes prior to your procedure for registration. You will need to go to outpatient registration (1st floor of the hospital) first. Make certain to bring your insurance cards as well as a complete list of medications.  Please remember the following:  1) Do not take any muscle relaxants, xanax (alprazolam) or ativan for 1 day prior to your test as well as the day of the test.  2) Nothing to eat or drink after 12:00 midnight on the night before your test.  3) Hold all diabetic medications/insulin the morning of the test. You may eat and take your medications after the test.  It will take at least 2 weeks to receive the results of this test from your physician.  ------------------------------------------ ABOUT ESOPHAGEAL MANOMETRY Esophageal manometry (muh-NOM-uh-tree) is a test that gauges how well your esophagus works. Your esophagus is the long, muscular tube that connects your throat to your stomach. Esophageal manometry measures the rhythmic muscle contractions (peristalsis) that occur in your esophagus when you swallow. Esophageal manometry also measures the coordination and force exerted by the muscles of your esophagus.  During esophageal manometry, a thin, flexible tube (catheter) that contains sensors is passed through your nose, down your esophagus and into your stomach. Esophageal  manometry can be helpful in diagnosing some mostly uncommon disorders that affect your esophagus.  Why it's done Esophageal manometry is used to evaluate the movement (motility) of food through the esophagus and into the stomach. The test measures how well the circular bands of muscle (sphincters) at the top and bottom of your esophagus open and close, as well as the pressure, strength and pattern of the wave of esophageal muscle contractions that moves food along.  What you can expect Esophageal manometry is an outpatient procedure done without sedation. Most people tolerate it well. You may be asked to change into a hospital gown before the test starts.  During esophageal manometry  While you are sitting up, a member of your health care team sprays your throat with a numbing medication or puts numbing gel in your nose or both.  A catheter is guided through your nose into your esophagus. The catheter may be sheathed in a water-filled sleeve. It doesn't interfere with your breathing. However, your eyes may water, and you may gag. You may have a slight nosebleed from irritation.  After the catheter is in place, you may be asked to lie on your back on an exam table, or you may be asked to remain seated.  You then swallow small sips of water. As you do, a computer connected to the catheter records the pressure, strength and pattern of your esophageal muscle contractions.  During the test, you'll be asked to breathe slowly and smoothly, remain as still as possible, and swallow only when you're asked to do so.  A member of your health care team may move the catheter down into your stomach while the  catheter continues its measurements.  The catheter then is slowly withdrawn. The test usually lasts 20 to 30 minutes.  After esophageal manometry  When your esophageal manometry is complete, you may return to your normal activities  This test typically takes 30-45 minutes to  complete. ________________________________________________________________________________

## 2024-02-12 NOTE — Progress Notes (Signed)
 Agree with the assessment and plan as outlined by [facility], FNP-C.  Breanna Bottino, DO, [facility]

## 2024-02-12 NOTE — Progress Notes (Signed)
 "  Chief Complaint:follow-up GERD Primary GI Doctor:Dr. San  HPI: Breanna [name] is a 49 y.o. female with medical history as outlined below, referred to the Gastroenterology Clinic for evaluation of GERD and to discuss colon cancer screening.   Interval History Patient last seen in GI office on 05/08/23 by Dr. San for GERD, colon cancer screening.  Patient presents for follow-up on GERD. Patient reports having issues with ongoing GERD for past two years.  Patient was switched to Protonix  40 mg BID and she has continued to take twice daily. She reports she continues with heartburn on empty stomach or even drinking water or chewing piece of gum. She has been taking Pepcid prn for breakthrough symptoms. She will have a lot of burping and chest discomfort. She will take off her bra to see if it provides relief.   Wt Readings from Last 3 Encounters:  02/12/24 197 lb 2 oz (89.4 kg)  07/04/23 194 lb (88 kg)  05/08/23 194 lb 6 oz (88.2 kg)    Past Medical History:  Diagnosis Date   Anemia    no current med.   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   Bursitis of right shoulder 07/2014   Depression    GERD (gastroesophageal reflux disease)    History of cardiac murmur as a child    no cardiologist   HTN (hypertension)    Insomnia     Past Surgical History:  Procedure Laterality Date   CESAREAN SECTION     x 3   DIAGNOSTIC LAPAROSCOPY     ECTOPIC PREGNANCY SURGERY     IUD REMOVAL N/A 07/25/2012   Procedure: INTRAUTERINE DEVICE (IUD) REMOVAL;  Surgeon: Oneil FORBES Piety, MD;  Location: [facility];  Service: Gynecology;  Laterality: N/A;   LAPAROSCOPIC TUBAL LIGATION Left 07/25/2012   Procedure: LAPAROSCOPIC TUBAL LIGATION;  Surgeon: Oneil FORBES Piety, MD;  Location: [facility];  Service: Gynecology;  Laterality: Left;  with filshie clip   ORIF ANKLE FRACTURE Right 09/08/2003   SHOULDER ARTHROSCOPY WITH SUBACROMIAL DECOMPRESSION Right 07/29/2014    Procedure: RIGHT SHOULDER ARTHROSCOPY WITH DEBRIDEMENT  and Acromioplasty;  Surgeon: Toribio Silos, MD;  Location: [facility];  Service: Orthopedics;  Laterality: Right;  ANESTHESIA:  GENERAL, PRE/POST OP SCALENE   WISDOM TOOTH EXTRACTION      Current Outpatient Medications  Medication Sig Dispense Refill   pantoprazole  (PROTONIX ) 40 MG tablet Take 1 tablet (40 mg total) by mouth daily. TAKE 1 TABLET BY MOUTH TWICE DAILY 30 TO 60 MINUTES BEFORE BREAKFAST AND BEFORE SUPPER. Please keep appointment for 02-12-24. 30 tablet 1   triamterene-hydrochlorothiazide (MAXZIDE-25) 37.5-25 MG tablet Take 0.5 tablets by mouth every morning.     Vitamin D, Ergocalciferol, (DRISDOL) 1.25 MG (50000 UNIT) CAPS capsule Take 50,000 Units by mouth once a week.     No current facility-administered medications for this visit.    Allergies as of 02/12/2024 - Review Complete 02/12/2024  Allergen Reaction Noted   Aloe Itching and Rash 07/24/2014    Family History  Problem Relation Age of Onset   Hypertension Mother    Hypertension Father    Heart failure Father    Diabetes Father    Diabetes Maternal Grandmother    Breast cancer Paternal Aunt    Lactose intolerance Son    ADD / ADHD Son    Lactose intolerance Son    ADD / ADHD Son    Lactose intolerance Son  Plantar fasciitis Son     Review of Systems:    Constitutional: No weight loss, fever, chills, weakness or fatigue HEENT: Eyes: No change in vision               Ears, Nose, Throat:  No change in hearing or congestion Skin: No rash or itching Cardiovascular: No chest pain, chest pressure or palpitations   Respiratory: No SOB or cough Gastrointestinal: See HPI and otherwise negative Genitourinary: No dysuria or change in urinary frequency Neurological: No headache, dizziness or syncope Musculoskeletal: No new muscle or joint pain Hematologic: No bleeding or bruising Psychiatric: No history of depression or anxiety    Physical  Exam:  Vital signs: BP 104/70 (BP Location: Left Arm, Patient Position: Sitting, Cuff Size: Large)   Pulse 68   Ht 5' 1 (1.549 m)   Wt 197 lb 2 oz (89.4 kg)   BMI 37.25 kg/m   Constitutional:   Pleasant female appears to be in NAD, Well developed, Well nourished, alert and cooperative Eyes:   PEERL, EOMI. No icterus. Conjunctiva pink. Neck:  Supple Throat: Oral cavity and pharynx without inflammation, swelling or lesion.  Respiratory: Respirations even and unlabored. Lungs clear to auscultation bilaterally.   No wheezes, crackles, or rhonchi.  Cardiovascular: Normal S1, S2. Regular rate and rhythm. No peripheral edema, cyanosis or pallor.  Gastrointestinal:  Soft, nondistended, nontender. No rebound or guarding. Normal bowel sounds. No appreciable masses or hepatomegaly. Rectal:  Not performed.  Msk:  Symmetrical without gross deformities. Without edema, no deformity or joint abnormality.  Neurologic:  Alert and  oriented x4;  grossly normal neurologically.  Skin:   Dry and intact without significant lesions or rashes.  RELEVANT LABS AND IMAGING: CBC    Latest Ref Rng & Units 07/29/2014   11:06 AM 07/24/2012   10:00 AM 12/02/2009    5:25 AM  CBC  WBC 4.0 - 10.5 K/uL  4.8  7.0   Hemoglobin 12.0 - 15.0 g/dL 87.3  86.9  6.9 REPEATED TO VERIFY CRITICAL RESULT CALLED TO, READ BACK BY AND VERIFIED WITH: LACETER,L ON 12/02/09 AT 0550 BY GARCONJ   Hematocrit 36.0 - 46.0 %  39.4  22.5   Platelets 150 - 400 K/uL  257  228      CMP     Latest Ref Rng & Units 11/06/2007   12:00 AM 12/05/2006    3:30 PM 07/15/2006    1:15 PM  CMP  Glucose 70 - 99 mg/dL 83  850  86   BUN 6 - 23 mg/dL 8  2  10    Creatinine 0.4 - 1.2 mg/dL 0.7  9.36  0.9   Sodium 135 - 145 meq/L 136  134  139   Potassium 3.5 - 5.1 meq/L 3.6  3.3  3.7   Chloride 96 - 112 meq/L 103  103  109   CO2 19 - 32 meq/L 23  24    Calcium 8.4 - 10.5 mg/dL 9.0  9.0    Total Protein 6.0 - 8.3 g/dL 7.3     Total Bilirubin 0.3 - 1.2  mg/dL 0.9     Alkaline Phos 39 - 117 units/L 77     AST 0 - 37 units/L 20     ALT 0 - 35 units/L 13        Lab Results  Component Value Date   TSH 0.94 11/06/2007  07/04/23 colonoscopy, recall 10 years  - One 3 mm polyp in the ascending colon,  removed with a cold snare. Resected and retrieved. - Diverticulosis in the sigmoid colon. - The distal rectum and anal verge are normal on retroflexion view. - The examined portion of the ileum was normal. Path: 2. Surgical [P], colon, ascending, polyp (1) :       - COLONIC MUCOSA WITH HYPERPLASTIC LYMPHOID AGGREGATE.       - NO ADENOMATOUS CHANGE OR POLYPOID EPITHELIAL SERRATION IDENTIFIED.   07/04/23 EGD - Normal esophagus.  - Normal stomach. Biopsied. - Normal examined duodenum.  Path: 1. Surgical [P], gastric :       -FOCAL MILD FOVEOLAR HYPERPLASIA, OTHERWISE UNREMARKABLE GASTRIC MUCOSA.       -NO INFLAMMATORY PATTERN PREDICTIVE OF HELICOBACTER PYLORI INFECTION IDENTIFIED.       -NEGATIVE FOR INTESTINAL METAPLASIA AND MALIGNANCY.   Assessment/Plan: Encounter Diagnoses  Name Primary?   Gastroesophageal reflux disease, unspecified whether esophagitis present Yes   Abdominal pain, epigastric   49 year old female patient with uncontrolled GERD on PPI therapy twice daily.  Endoscopy with biopsy negative.  Will go ahead and proceed with esophageal manometry with pH study off of acid suppression medication to evaluate.  Patient also complains of epigastric pain that radiates across her bra line, worse with certain types of foods such as red meat.  Will go ahead and order abdominal ultrasound right upper quadrant to rule out acute process.  #1 GERD #2 Epigastric pain - Abdominal ultrasound RUQ - if she does not respond to high dose PPI next step will be esophageal manometry with pH study off of acid suppression medication  #3 History of colonic polyps  -recall 10 years    Thank you for the courtesy of this consult. Please call me with any  questions or concerns.   Audryanna Zurita, FNP-C [facility] 02/12/2024, 9:34 AM  Cc: Royden Ronal Czar, FNP  "

## 2024-02-18 ENCOUNTER — Ambulatory Visit (HOSPITAL_COMMUNITY)
Admission: RE | Admit: 2024-02-18 | Discharge: 2024-02-18 | Disposition: A | Discharge status: Home / Self Care | Source: Ambulatory Visit | Attending: Gastroenterology | Admitting: Gastroenterology

## 2024-02-18 DIAGNOSIS — R1013 Epigastric pain: Secondary | ICD-10-CM | POA: Insufficient documentation

## 2024-02-18 DIAGNOSIS — K219 Gastro-esophageal reflux disease without esophagitis: Secondary | ICD-10-CM | POA: Diagnosis present

## 2024-02-22 ENCOUNTER — Ambulatory Visit: Payer: Self-pay | Admitting: Gastroenterology

## 2024-02-22 NOTE — Telephone Encounter (Signed)
-----   Message from [facility], NP sent at 02/22/2024 10:53 AM EST ----- [name]- Let patient know abdominal ultrasound normal. No significant sonographic abnormality of the liver or gallbladder.    Deanna, NP

## 2024-05-21 ENCOUNTER — Encounter (HOSPITAL_COMMUNITY): Payer: Self-pay

## 2024-05-21 ENCOUNTER — Ambulatory Visit (HOSPITAL_COMMUNITY): Admit: 2024-05-21 | Admitting: Gastroenterology

## 2024-05-21 SURGERY — MANOMETRY, ESOPHAGUS
Anesthesia: Monitor Anesthesia Care | Laterality: Bilateral
